# Patient Record
Sex: Female | Born: 1965 | Race: White | Hispanic: No | Smoking: Never smoker
Health system: Southern US, Community
[De-identification: ages and names within clinical notes are randomized; demographics above are authoritative.]

## PROBLEM LIST (undated history)

## (undated) DIAGNOSIS — J45909 Unspecified asthma, uncomplicated: Secondary | ICD-10-CM

## (undated) DIAGNOSIS — G510 Bell's palsy: Secondary | ICD-10-CM

## (undated) DIAGNOSIS — E7800 Pure hypercholesterolemia, unspecified: Secondary | ICD-10-CM

## (undated) DIAGNOSIS — F32A Depression, unspecified: Secondary | ICD-10-CM

## (undated) DIAGNOSIS — E119 Type 2 diabetes mellitus without complications: Secondary | ICD-10-CM

## (undated) DIAGNOSIS — I639 Cerebral infarction, unspecified: Secondary | ICD-10-CM

## (undated) HISTORY — PX: APPENDECTOMY: SHX54

## (undated) HISTORY — PX: OOPHORECTOMY: SHX86

---

## 2006-02-16 ENCOUNTER — Emergency Department (HOSPITAL_COMMUNITY): Admission: EM | Admit: 2006-02-16 | Discharge: 2006-02-17 | Payer: Self-pay | Admitting: Emergency Medicine

## 2017-09-17 ENCOUNTER — Emergency Department (INDEPENDENT_AMBULATORY_CARE_PROVIDER_SITE_OTHER)
Admission: EM | Admit: 2017-09-17 | Discharge: 2017-09-17 | Disposition: A | Discharge status: Home / Self Care | Payer: Managed Care, Other (non HMO) | Source: Home / Self Care | Attending: Family Medicine | Admitting: Family Medicine

## 2017-09-17 ENCOUNTER — Emergency Department (INDEPENDENT_AMBULATORY_CARE_PROVIDER_SITE_OTHER): Payer: Managed Care, Other (non HMO)

## 2017-09-17 ENCOUNTER — Other Ambulatory Visit: Payer: Self-pay

## 2017-09-17 DIAGNOSIS — M778 Other enthesopathies, not elsewhere classified: Secondary | ICD-10-CM

## 2017-09-17 DIAGNOSIS — M779 Enthesopathy, unspecified: Principal | ICD-10-CM

## 2017-09-17 DIAGNOSIS — M25571 Pain in right ankle and joints of right foot: Secondary | ICD-10-CM

## 2017-09-17 DIAGNOSIS — M7750 Other enthesopathy of unspecified foot: Secondary | ICD-10-CM

## 2017-09-17 DIAGNOSIS — M7731 Calcaneal spur, right foot: Secondary | ICD-10-CM

## 2017-09-17 HISTORY — DX: Type 2 diabetes mellitus without complications: E11.9

## 2017-09-17 MED ORDER — HYDROCODONE-ACETAMINOPHEN 5-325 MG PO TABS: 1.0000 | ORAL_TABLET | Freq: Four times a day (QID) | ORAL | 0 refills | Status: DC | PRN

## 2017-09-17 NOTE — Discharge Instructions (Signed)
Apply heating pad 2 or 3 times daily.  Elevate.  Use crutches for 3 to 5 days.  Wear Ace wrap until swelling decreases.   Begin range of motion and stretching exercises.

## 2017-09-17 NOTE — ED Provider Notes (Signed)
[facility]    CSN: [id number] Arrival date & time: 09/17/17  1913     History   Chief Complaint Chief Complaint  Patient presents with  . Ankle Pain    Right  . Foot Pain    HPI Teresa Maxwell is a 51 y.o. female.   Patient reports that she has been having intermittent pain in the dorsum of her right foot for over two weeks.  She fell two weeks ago, but does not recall injuring her right foot although her pain and swelling have gradually increased.  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  She reports that application of ice packs has not been helpful, and she has had poor response to ibuprofen.   The history is provided by the patient.  Foot Pain  This is a recurrent problem. Episode onset: 2+ weeks ago. The problem occurs constantly. The problem has been gradually worsening. The symptoms are aggravated by walking and standing. Nothing relieves the symptoms. Treatments tried: Ibuprofen. The treatment provided mild relief.    Past Medical History:  Diagnosis Date  . Diabetes mellitus without complication (HCC)    type II    There are no active problems to display for this patient.   Past Surgical History:  Procedure Laterality Date  . APPENDECTOMY    . OOPHORECTOMY     right    OB History    No data available       Home Medications    Prior to Admission medications   Medication Sig Start Date End Date Taking? Authorizing Provider  ibuprofen (ADVIL,MOTRIN) 600 MG tablet Take 600 mg by mouth every 6 (six) hours as needed.   Yes [provider]  levonorgestrel (MIRENA) 20 MCG/24HR IUD 1 each by Intrauterine route once.   Yes [provider]  HYDROcodone-acetaminophen (NORCO/VICODIN) 5-325 MG tablet Take 1 tablet by mouth every 6 (six) hours as needed for moderate pain. 09/17/17   Lattie Haw, MD    Family History Family History    Problem Relation Age of Onset  . Heart failure Mother   . Cancer Father     Social History Social History   Tobacco Use  . Smoking status: Never Smoker  . Smokeless tobacco: Never Used  Substance Use Topics  . Alcohol use: No    Frequency: Never  . Drug use: No     Allergies   Other   Review of Systems Review of Systems  All other systems reviewed and are negative.    Physical Exam Triage Vital Signs ED Triage Vitals  Enc Vitals Group     BP 09/17/17 1937 137/82     Pulse Rate 09/17/17 1937 85     Resp --      Temp 09/17/17 1937 98.5 F (36.9 C)     Temp Source 09/17/17 1937 Oral     SpO2 09/17/17 1937 97 %     Weight 09/17/17 1938 198 lb (89.8 kg)     Height 09/17/17 1938 5\' 8"  (1.727 m)     Head Circumference --      Peak Flow --      Pain Score 09/17/17 1938 4     Pain Loc --      Pain Edu? --      Excl. in GC? --    No data found.  Updated Vital Signs BP 137/82 (BP Location:  Right Arm)   Pulse 85   Temp 98.5 F (36.9 C) (Oral)   Ht 5\' 8"  (1.727 m)   Wt 198 lb (89.8 kg)   SpO2 97%   BMI 30.11 kg/m   Visual Acuity Right Eye Distance:   Left Eye Distance:   Bilateral Distance:    Right Eye Near:   Left Eye Near:    Bilateral Near:     Physical Exam  Constitutional: She appears well-developed and well-nourished. No distress.  HENT:  Head: Atraumatic.  Eyes: Pupils are equal, round, and reactive to light.  Cardiovascular: Normal rate.  Pulmonary/Chest: Effort normal.  Musculoskeletal:       Right foot: There is tenderness and swelling. There is normal range of motion, no bony tenderness, normal capillary refill, no crepitus, no deformity and no laceration.       Feet:  Right foot is mildly swollen dorsally without erythema.  There is distinct tenderness to palpation dorsally over the extensor tendons, and pain is recreated by palpating there during resisted dorsiflexion. Right foot has tenderness to palpation over posterior tibial  tendon extending into arch.  Pain elicited by resisted plantar flexion and resisted inversion of the ankle while palpating over the posterior tibial tendon.  Neurological: She is alert.  Skin: Skin is warm and dry. No rash noted.  Nursing note and vitals reviewed.    UC Treatments / Results  Labs (all labs ordered are listed, but only abnormal results are displayed) Labs Reviewed - No data to display  EKG  EKG Interpretation None       Radiology Dg Ankle Complete Right  Result Date: 09/17/2017 CLINICAL DATA:  Fall 2 weeks ago. Right ankle pain and swelling. Initial encounter. EXAM: RIGHT ANKLE - COMPLETE 3+ VIEW COMPARISON:  None. FINDINGS: Diffuse soft tissue swelling is noted. A well corticated ossific density is seen along the inferior margin of the lateral malleolus, consistent with an old avulsion injury. No acute fracture identified. No evidence of dislocation. No other significant bone abnormality identified. IMPRESSION: Diffuse soft tissue swelling. No evidence of acute fracture or dislocation. Electronically Signed   By: Myles RosenthalJohn  Stahl M.D.   On: 09/17/2017 20:05   Dg Foot Complete Right  Result Date: 09/17/2017 CLINICAL DATA:  Right foot pain after remote fall 2 weeks ago. EXAM: RIGHT FOOT COMPLETE - 3+ VIEW COMPARISON:  None. FINDINGS: There is soft tissue swelling about the malleoli and midfoot. On the PA view of the foot, there are 2 ossific densities adjacent to the posteromedial aspect of the navicular. These range in size from 3 to 4 mm. These may simply reflect accessory ossicles. If the patient is point tender over the medial midfoot, these ossific densities could potentially represent small avulsions. A donor site however is not clearly identified and findings are more likely to represent accessory ossicles. Joint spaces are maintained. There is a small plantar calcaneal enthesophyte. IMPRESSION: 1. Two small ossific densities ranging in size from 3-4 mm are noted adjacent  to the navicular bone. Findings likely represent small accessory ossicles however the possibility of small avulsions is not excluded though believed less likely given lack of a donor site. 2. Generalized soft tissue swelling about the malleoli and midfoot. 3. Small plantar calcaneal enthesophyte. Electronically Signed   By: Tollie Ethavid  Kwon M.D.   On: 09/17/2017 20:07    Procedures Procedures (including critical care time)  Medications Ordered in UC Medications - No data to display   Initial Impression / Assessment and Plan /  UC Course  I have reviewed the triage vital signs and the nursing notes.  Pertinent labs & imaging results that were available during my care of the patient were reviewed by me and considered in my medical decision making (see chart for details).    Suspect posterior tibial tendonitis as well. Ace wrap applied.  Dispensed crutches. She is reluctant to try prednisone because of her type 2 diabetes. Rx for Lortab at bedtime. Controlled Substance Prescriptions I have consulted the [place] Controlled Substances Registry for this patient, and feel the risk/benefit ratio today is favorable for proceeding with this prescription for a controlled substance.  Apply heating pad 2 or 3 times daily.  Elevate.  Use crutches for 3 to 5 days.  Wear Ace wrap until swelling decreases.   Begin calf stretching exercises. Arrange follow-up visit with Sports Medicine.    Final Clinical Impressions(s) / UC Diagnoses   Final diagnoses:  Extensor tendonitis of foot    ED Discharge Orders        Ordered    HYDROcodone-acetaminophen (NORCO/VICODIN) 5-325 MG tablet  Every 6 hours PRN     09/17/17 2033           Lattie HawBeese, Stephen A, MD 09/19/17 1245

## 2017-09-17 NOTE — ED Triage Notes (Signed)
Pt stated she has had pain in the top of her foot for a couple of weeks.  Last night worked a 12 hour shift, and swelling and pain increased.  Today the pain has been much worse.  Swelling from just above her ankle through the foot.

## 2017-09-24 ENCOUNTER — Ambulatory Visit: Payer: Managed Care, Other (non HMO) | Admitting: Sports Medicine

## 2018-03-06 HISTORY — PX: BREAST BIOPSY: SHX20

## 2018-03-09 ENCOUNTER — Other Ambulatory Visit: Payer: Self-pay | Admitting: Obstetrics and Gynecology

## 2018-03-09 DIAGNOSIS — R928 Other abnormal and inconclusive findings on diagnostic imaging of breast: Secondary | ICD-10-CM

## 2018-03-15 ENCOUNTER — Ambulatory Visit
Admission: RE | Admit: 2018-03-15 | Discharge: 2018-03-15 | Disposition: A | Discharge status: Home / Self Care | Payer: Managed Care, Other (non HMO) | Source: Ambulatory Visit | Attending: Obstetrics and Gynecology | Admitting: Obstetrics and Gynecology

## 2018-03-15 ENCOUNTER — Other Ambulatory Visit: Payer: Self-pay | Admitting: Obstetrics and Gynecology

## 2018-03-15 DIAGNOSIS — R928 Other abnormal and inconclusive findings on diagnostic imaging of breast: Secondary | ICD-10-CM

## 2018-03-19 ENCOUNTER — Ambulatory Visit
Admission: RE | Admit: 2018-03-19 | Discharge: 2018-03-19 | Disposition: A | Discharge status: Home / Self Care | Payer: Managed Care, Other (non HMO) | Source: Ambulatory Visit | Attending: Obstetrics and Gynecology | Admitting: Obstetrics and Gynecology

## 2018-03-19 ENCOUNTER — Other Ambulatory Visit: Payer: Self-pay | Admitting: Obstetrics and Gynecology

## 2018-03-19 DIAGNOSIS — R928 Other abnormal and inconclusive findings on diagnostic imaging of breast: Secondary | ICD-10-CM

## 2018-07-27 ENCOUNTER — Other Ambulatory Visit (HOSPITAL_COMMUNITY): Payer: Self-pay | Admitting: Internal Medicine

## 2018-07-27 DIAGNOSIS — R1011 Right upper quadrant pain: Secondary | ICD-10-CM

## 2018-07-28 ENCOUNTER — Ambulatory Visit (HOSPITAL_COMMUNITY)
Admission: RE | Admit: 2018-07-28 | Discharge: 2018-07-28 | Disposition: A | Discharge status: Home / Self Care | Payer: Managed Care, Other (non HMO) | Source: Ambulatory Visit | Attending: Internal Medicine | Admitting: Internal Medicine

## 2018-07-28 DIAGNOSIS — R1011 Right upper quadrant pain: Secondary | ICD-10-CM | POA: Diagnosis present

## 2018-07-28 DIAGNOSIS — R932 Abnormal findings on diagnostic imaging of liver and biliary tract: Secondary | ICD-10-CM | POA: Insufficient documentation

## 2018-08-26 ENCOUNTER — Other Ambulatory Visit (HOSPITAL_COMMUNITY): Payer: Self-pay | Admitting: Internal Medicine

## 2018-08-26 DIAGNOSIS — R1011 Right upper quadrant pain: Secondary | ICD-10-CM

## 2018-09-14 ENCOUNTER — Ambulatory Visit (HOSPITAL_COMMUNITY)
Admission: RE | Admit: 2018-09-14 | Discharge: 2018-09-14 | Disposition: A | Discharge status: Home / Self Care | Payer: Managed Care, Other (non HMO) | Source: Ambulatory Visit | Attending: Internal Medicine | Admitting: Internal Medicine

## 2018-09-14 DIAGNOSIS — R1011 Right upper quadrant pain: Secondary | ICD-10-CM | POA: Diagnosis present

## 2018-09-14 MED ORDER — TECHNETIUM TC 99M MEBROFENIN IV KIT
5.0000 | PACK | Freq: Once | INTRAVENOUS | Status: AC | PRN
  Administered 2018-09-14: 5 via INTRAVENOUS

## 2018-11-03 DIAGNOSIS — E119 Type 2 diabetes mellitus without complications: Secondary | ICD-10-CM | POA: Diagnosis not present

## 2018-11-15 DIAGNOSIS — R1032 Left lower quadrant pain: Secondary | ICD-10-CM | POA: Diagnosis not present

## 2018-11-15 DIAGNOSIS — Z6829 Body mass index (BMI) 29.0-29.9, adult: Secondary | ICD-10-CM | POA: Diagnosis not present

## 2018-11-15 DIAGNOSIS — R1012 Left upper quadrant pain: Secondary | ICD-10-CM | POA: Diagnosis not present

## 2018-11-24 DIAGNOSIS — R194 Change in bowel habit: Secondary | ICD-10-CM | POA: Diagnosis not present

## 2018-11-24 DIAGNOSIS — R109 Unspecified abdominal pain: Secondary | ICD-10-CM | POA: Diagnosis not present

## 2018-11-24 DIAGNOSIS — K5900 Constipation, unspecified: Secondary | ICD-10-CM | POA: Diagnosis not present

## 2018-11-26 ENCOUNTER — Other Ambulatory Visit: Payer: Self-pay | Admitting: Gastroenterology

## 2018-11-26 DIAGNOSIS — R109 Unspecified abdominal pain: Secondary | ICD-10-CM

## 2018-12-03 ENCOUNTER — Ambulatory Visit
Admission: RE | Admit: 2018-12-03 | Discharge: 2018-12-03 | Disposition: A | Discharge status: Home / Self Care | Payer: BLUE CROSS/BLUE SHIELD | Source: Ambulatory Visit | Attending: Gastroenterology | Admitting: Gastroenterology

## 2018-12-03 DIAGNOSIS — D259 Leiomyoma of uterus, unspecified: Secondary | ICD-10-CM | POA: Diagnosis not present

## 2018-12-03 DIAGNOSIS — R109 Unspecified abdominal pain: Secondary | ICD-10-CM

## 2018-12-03 MED ORDER — IOPAMIDOL (ISOVUE-300) INJECTION 61%
100.0000 mL | Freq: Once | INTRAVENOUS | Status: AC | PRN
  Administered 2018-12-03: 100 mL via INTRAVENOUS

## 2019-03-28 DIAGNOSIS — E7849 Other hyperlipidemia: Secondary | ICD-10-CM | POA: Diagnosis not present

## 2019-03-28 DIAGNOSIS — E119 Type 2 diabetes mellitus without complications: Secondary | ICD-10-CM | POA: Diagnosis not present

## 2019-03-28 DIAGNOSIS — Z0000 Encounter for general adult medical examination without abnormal findings: Secondary | ICD-10-CM | POA: Diagnosis not present

## 2019-04-04 DIAGNOSIS — Z0000 Encounter for general adult medical examination without abnormal findings: Secondary | ICD-10-CM | POA: Diagnosis not present

## 2019-04-04 DIAGNOSIS — E785 Hyperlipidemia, unspecified: Secondary | ICD-10-CM | POA: Diagnosis not present

## 2019-04-04 DIAGNOSIS — Z1331 Encounter for screening for depression: Secondary | ICD-10-CM | POA: Diagnosis not present

## 2019-04-04 DIAGNOSIS — J45909 Unspecified asthma, uncomplicated: Secondary | ICD-10-CM | POA: Diagnosis not present

## 2019-04-04 DIAGNOSIS — H8110 Benign paroxysmal vertigo, unspecified ear: Secondary | ICD-10-CM | POA: Diagnosis not present

## 2019-04-04 DIAGNOSIS — E119 Type 2 diabetes mellitus without complications: Secondary | ICD-10-CM | POA: Diagnosis not present

## 2019-08-25 DIAGNOSIS — Z20818 Contact with and (suspected) exposure to other bacterial communicable diseases: Secondary | ICD-10-CM | POA: Diagnosis not present

## 2019-08-25 DIAGNOSIS — B349 Viral infection, unspecified: Secondary | ICD-10-CM | POA: Diagnosis not present

## 2019-08-25 DIAGNOSIS — R0600 Dyspnea, unspecified: Secondary | ICD-10-CM | POA: Diagnosis not present

## 2019-08-25 DIAGNOSIS — R05 Cough: Secondary | ICD-10-CM | POA: Diagnosis not present

## 2019-10-16 DIAGNOSIS — Z23 Encounter for immunization: Secondary | ICD-10-CM | POA: Diagnosis not present

## 2020-07-30 IMAGING — NM NM HEPATO W/GB/PHARM/[PERSON_NAME]
3 series · 13 of 13 positions shown · non-contrast
Comparison: None.

CLINICAL DATA: Abdominal pain and nausea

EXAM:
NUCLEAR MEDICINE HEPATOBILIARY IMAGING WITH GALLBLADDER EF
VIEWS:
Anterior, right lateral right upper quadrant
RADIOPHARMACEUTICALS:  5.15 mCi 1c-UUm  Choletec IV

[he hepatobiliary · 2.26mm/px · 1 of 1 slices shown (1 of 3)]
[im 1/1]
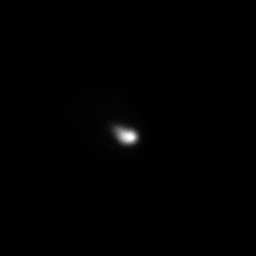

[he hepatobiliary · 4.52mm/px · 6 of 60 frames shown (2 of 3)]
[frame 6/60]
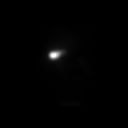
[frame 16/60]
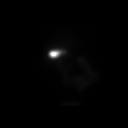
[frame 26/60]
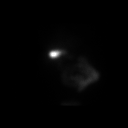
[frame 36/60]
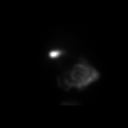
[frame 46/60]
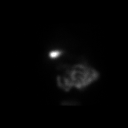
[frame 56/60]
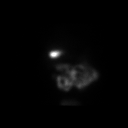

[he hepatobiliary · 4.52mm/px · 6 of 60 frames shown (3 of 3)]
[frame 6/60]
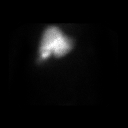
[frame 16/60]
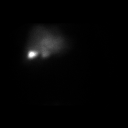
[frame 26/60]
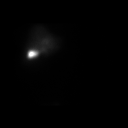
[frame 36/60]
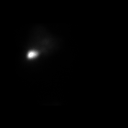
[frame 46/60]
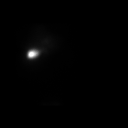
[frame 56/60]
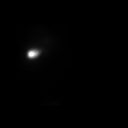

[13 of 13 positions shown; findings below may reference images not displayed]

FINDINGS: Liver uptake of radiotracer is unremarkable. There is prompt
visualization of gallbladder and small bowel, indicating patency of
the cystic and common bile ducts. The patient consumed 8 ounces of
Ensure orally with calculation of the computer generated ejection
fraction of radiotracer from the gallbladder. The patient
experienced nausea with the oral Ensure consumption. The computer
generated ejection fraction of radiotracer from the gallbladder is
normal at 58%, normal greater than 33% using the oral agent.
IMPRESSION: Normal ejection fraction of radiotracer from the gallbladder. The
patient did experience nausea with the oral Ensure consumption.
Cystic and common bile ducts are patent as is evidenced by
visualization of gallbladder and small bowel.

## 2020-10-15 DIAGNOSIS — Z1152 Encounter for screening for COVID-19: Secondary | ICD-10-CM | POA: Diagnosis not present

## 2020-10-18 IMAGING — CT CT ABD-PELV W/ CM
1 of 3 series · 13 of 32 positions shown, 19 images · IV contrast (iopamidol)
Comparison: None.

CLINICAL DATA: Mid and lower abdominal pain for 3 weeks.
Diverticulitis. Endometriosis. Previous appendectomy and right
oophorectomy.

EXAM:
CT ABDOMEN AND PELVIS WITH CONTRAST
TECHNIQUE: Multidetector CT imaging of the abdomen and pelvis was performed
using the standard protocol following bolus administration of
intravenous contrast.
CONTRAST:  100mL 8PV4GS-BEE IOPAMIDOL (8PV4GS-BEE) INJECTION 61%

[Series 2: abd/pelvis w/cm · axial · 0.88mm/px · z∈[-487,-67]mm · 13 of 99 slices shown, 19 images]
[im 8/99  soft-tissue]
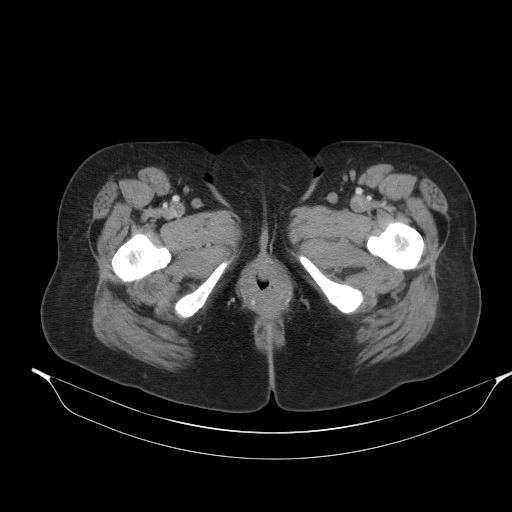
[im 8/99  bone]
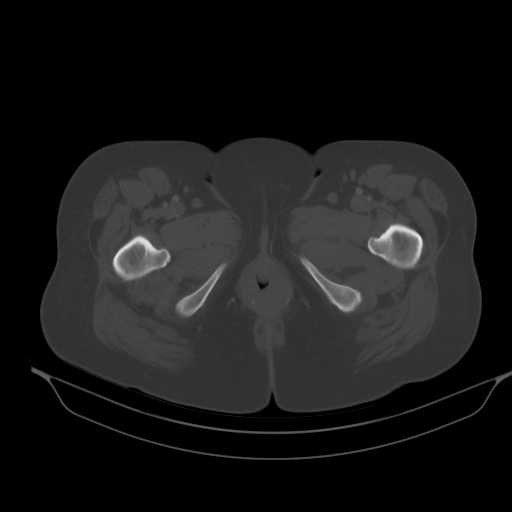
[im 15/99  soft-tissue]
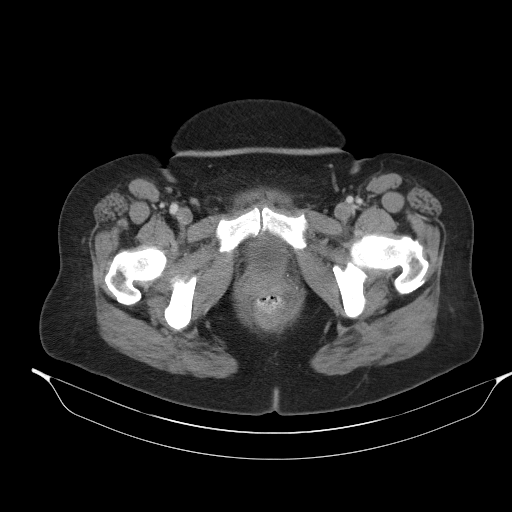
[im 22/99  soft-tissue]
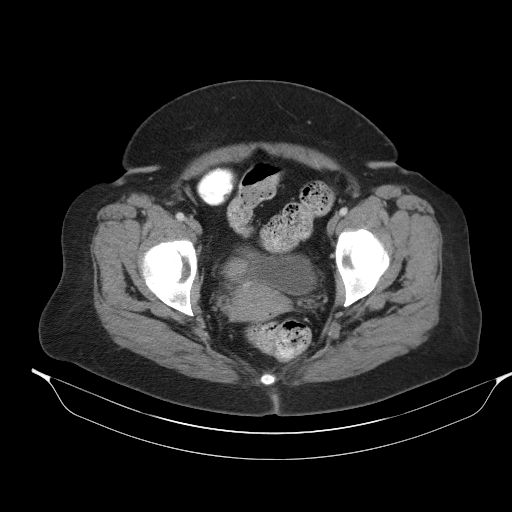
[im 29/99  soft-tissue]
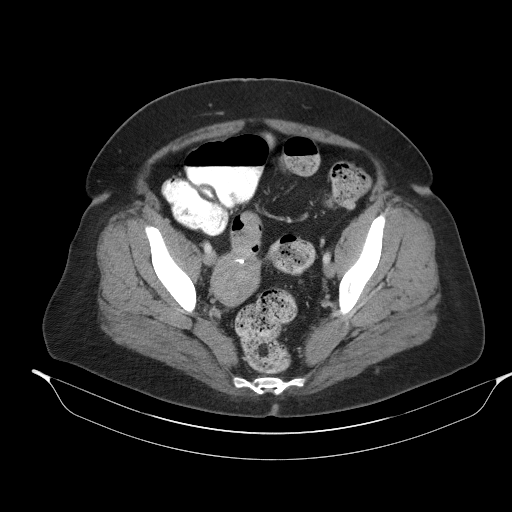
[im 36/99  soft-tissue]
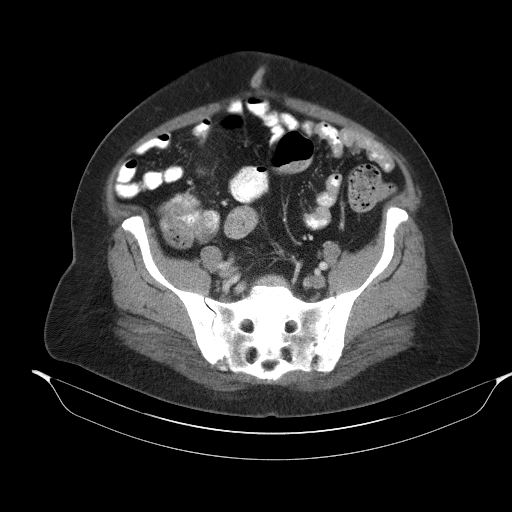
[im 43/99  soft-tissue]
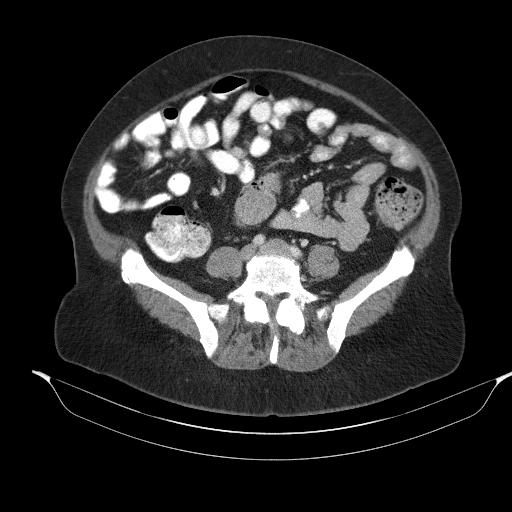
[im 50/99  soft-tissue]
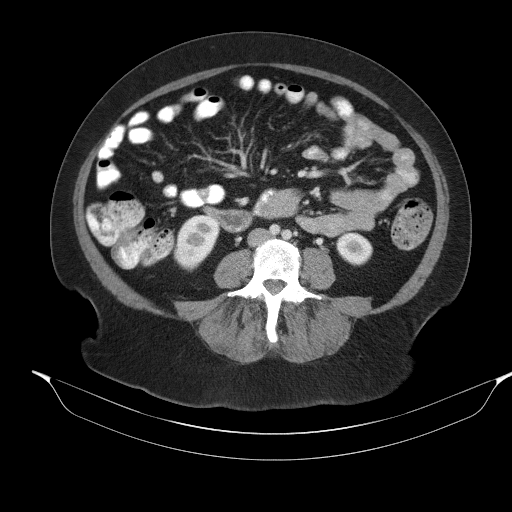
[im 57/99  soft-tissue]
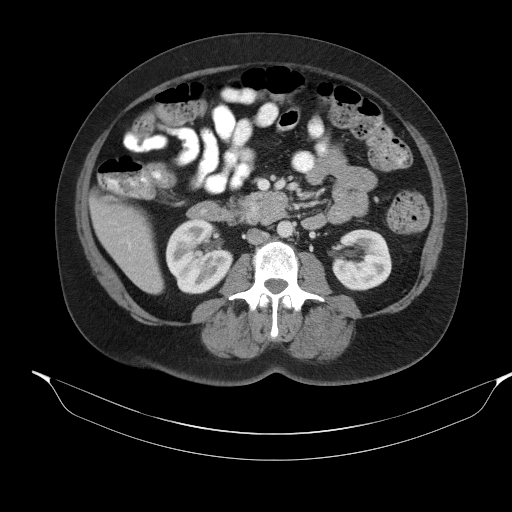
[im 64/99  soft-tissue]
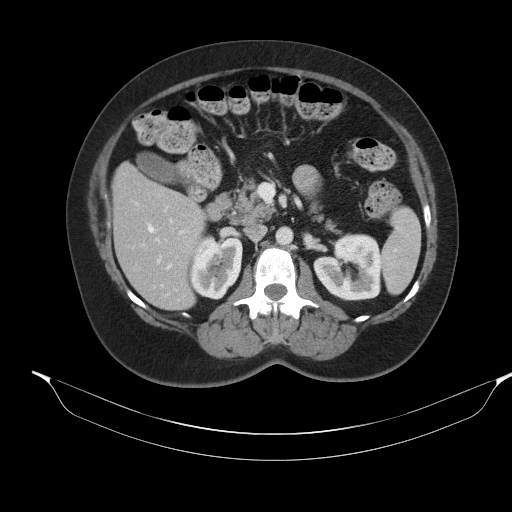
[im 64/99  bone]
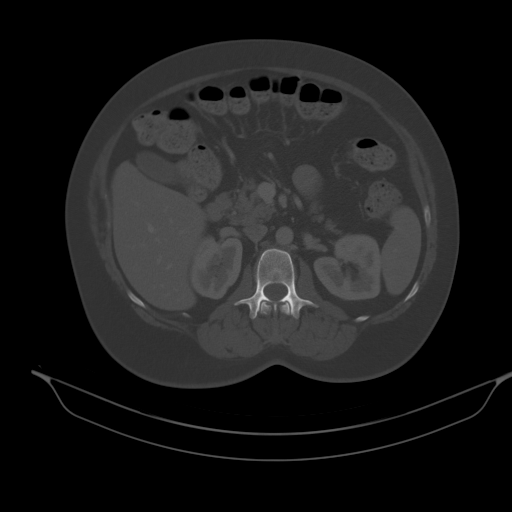
[im 71/99  soft-tissue]
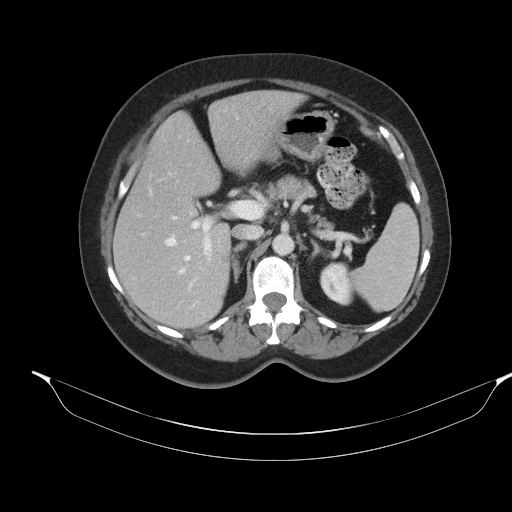
[im 71/99  lung]
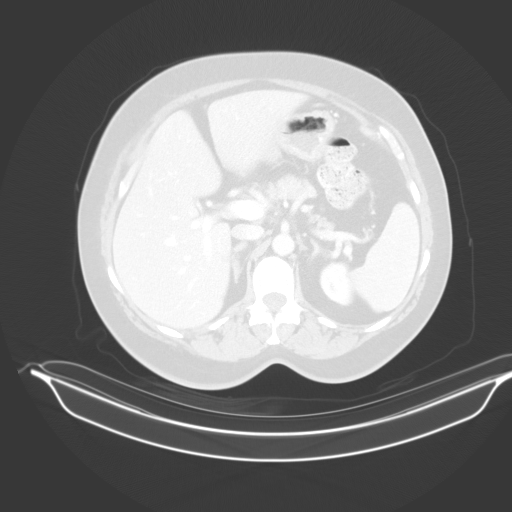
[im 78/99  soft-tissue]
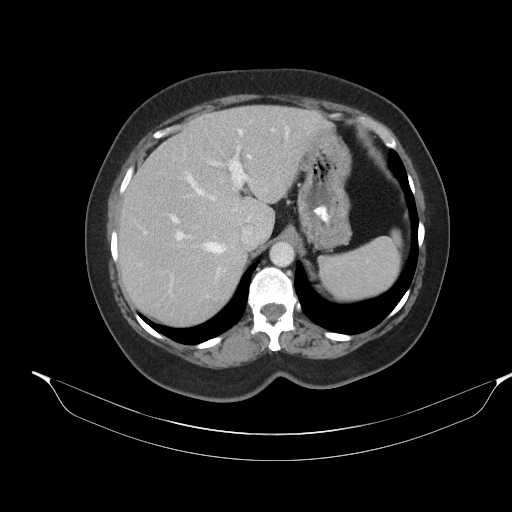
[im 78/99  lung]
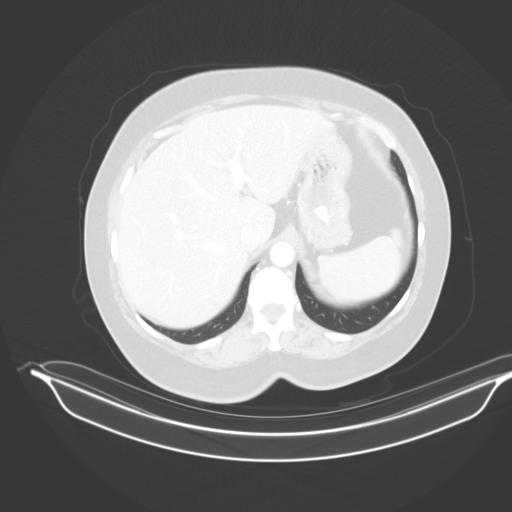
[im 85/99  soft-tissue]
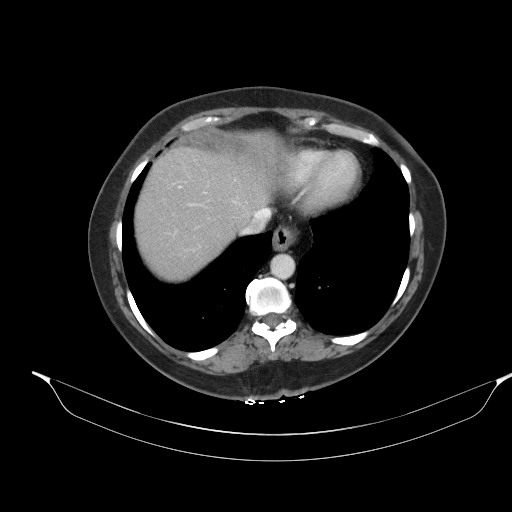
[im 85/99  lung]
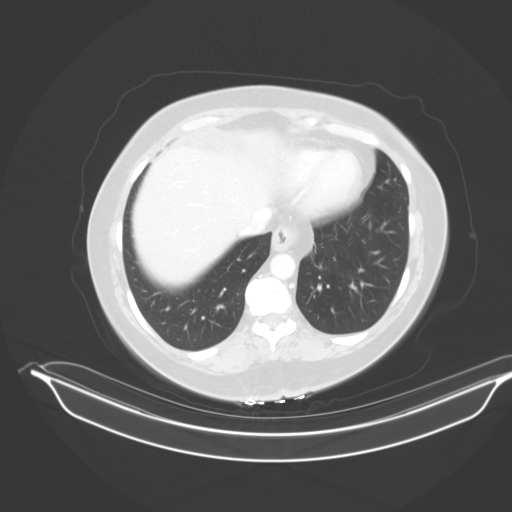
[im 92/99  soft-tissue]
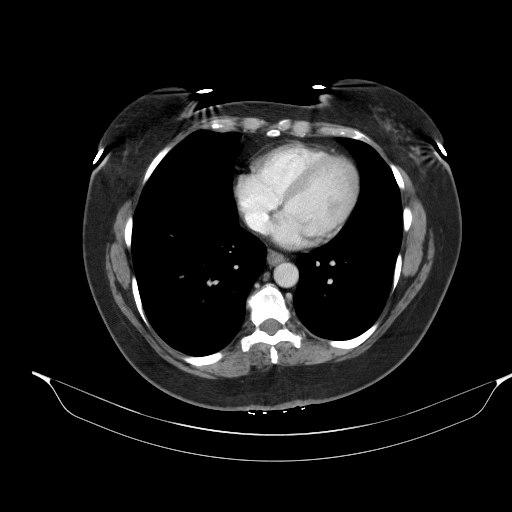
[im 92/99  lung]
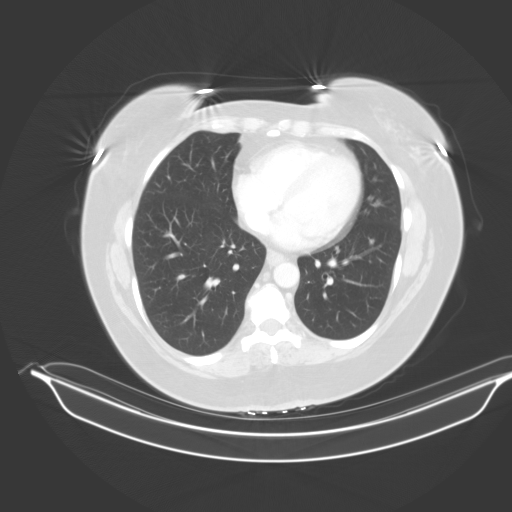

[13 of 32 positions shown; findings below may reference images not displayed]

FINDINGS: Lower Chest: No acute findings.

Hepatobiliary: No hepatic masses identified. Gallbladder is
unremarkable.

Pancreas:  No mass or inflammatory changes.

Spleen: Within normal limits in size and appearance.

Adrenals/Urinary Tract: No masses identified. Probable tiny sub-cm
right renal cyst noted. No evidence of ureteral calculi or
hydronephrosis.

Stomach/Bowel: Small hiatal hernia noted. No evidence of
obstruction, inflammatory process or abnormal fluid collections.

Vascular/Lymphatic: No pathologically enlarged lymph nodes. No
abdominal aortic aneurysm.

Reproductive: IUD is seen in expected position in the uterus. A few
small posterior uterine fibroids are seen, largest measuring
approximately 2.5 cm. Adnexal regions are unremarkable. No evidence
of ascites or peritoneal thickening.

Other:  None.

Musculoskeletal:  No suspicious bone lesions identified.
IMPRESSION: 1. No acute findings within the abdomen or pelvis.
2. Small hiatal hernia.
3. Several small uterine fibroids.  IUD in expected position.

## 2022-02-08 ENCOUNTER — Encounter (HOSPITAL_COMMUNITY): Payer: Self-pay | Admitting: Emergency Medicine

## 2022-02-08 ENCOUNTER — Other Ambulatory Visit: Payer: Self-pay

## 2022-02-08 ENCOUNTER — Emergency Department (HOSPITAL_COMMUNITY)
Admission: EM | Admit: 2022-02-08 | Discharge: 2022-02-08 | Disposition: A | Discharge status: Home / Self Care | Payer: BC Managed Care – PPO | Attending: Emergency Medicine | Admitting: Emergency Medicine

## 2022-02-08 DIAGNOSIS — R739 Hyperglycemia, unspecified: Secondary | ICD-10-CM | POA: Diagnosis present

## 2022-02-08 DIAGNOSIS — E1165 Type 2 diabetes mellitus with hyperglycemia: Secondary | ICD-10-CM | POA: Insufficient documentation

## 2022-02-08 HISTORY — DX: Pure hypercholesterolemia, unspecified: E78.00

## 2022-02-08 HISTORY — DX: Depression, unspecified: F32.A

## 2022-02-08 LAB — URINALYSIS, ROUTINE W REFLEX MICROSCOPIC
Bilirubin Urine: NEGATIVE
Glucose, UA: NEGATIVE mg/dL
Hgb urine dipstick: NEGATIVE
Ketones, ur: NEGATIVE mg/dL
Nitrite: NEGATIVE
Protein, ur: NEGATIVE mg/dL
Specific Gravity, Urine: 1.019 (ref 1.005–1.030)
pH: 5 (ref 5.0–8.0)

## 2022-02-08 LAB — BASIC METABOLIC PANEL
Anion gap: 12 (ref 5–15)
BUN: 18 mg/dL (ref 6–20)
CO2: 20 mmol/L — ABNORMAL LOW (ref 22–32)
Calcium: 9.7 mg/dL (ref 8.9–10.3)
Chloride: 102 mmol/L (ref 98–111)
Creatinine, Ser: 0.79 mg/dL (ref 0.44–1.00)
GFR, Estimated: >60 mL/min (ref >60)
Glucose, Bld: 225 mg/dL — ABNORMAL HIGH (ref 70–99)
Potassium: 3.8 mmol/L (ref 3.5–5.1)
Sodium: 134 mmol/L — ABNORMAL LOW (ref 135–145)

## 2022-02-08 LAB — CBC
HCT: 40.7 % (ref 36.0–46.0)
Hemoglobin: 13.2 g/dL (ref 12.0–15.0)
MCH: 26.7 pg (ref 26.0–34.0)
MCHC: 32.4 g/dL (ref 30.0–36.0)
MCV: 82.2 fL (ref 80.0–100.0)
Platelets: 361 10*3/uL (ref 150–400)
RBC: 4.95 MIL/uL (ref 3.87–5.11)
RDW: 13.2 % (ref 11.5–15.5)
WBC: 9.2 10*3/uL (ref 4.0–10.5)
nRBC: 0 % (ref 0.0–0.2)

## 2022-02-08 LAB — CBG MONITORING, ED
Glucose-Capillary: 208 mg/dL — ABNORMAL HIGH (ref 70–99)
Glucose-Capillary: 140 mg/dL — ABNORMAL HIGH (ref 70–99)

## 2022-02-08 LAB — I-STAT BETA HCG BLOOD, ED (MC, WL, AP ONLY): I-stat hCG, quantitative: <5 m[IU]/mL (ref <5)

## 2022-02-08 MED ORDER — ONDANSETRON HCL 4 MG/2ML IJ SOLN: 4.0000 mg | Freq: Once | INTRAMUSCULAR | Status: DC

## 2022-02-08 MED ORDER — SODIUM CHLORIDE 0.9 % IV BOLUS
1000.0000 mL | Freq: Once | INTRAVENOUS | Status: AC
  Administered 2022-02-08: 1000 mL via INTRAVENOUS

## 2022-02-08 NOTE — ED Triage Notes (Signed)
[place] didn't check blood sugars.  States she had generalized abd pain, nausea, and fatigue.  Abd pain and nausea has resolved.  Fasting blood sugar 280 yesterday and 318 today.  Taking Metformin and Jardiance as prescribed.  Highest blood sugar today 457.  C/o fatigue. ?

## 2022-02-08 NOTE — ED Provider Notes (Signed)
?[facility] ?Provider Note ? ? ?CSN: [id number] ?Arrival date & time: 02/08/22  1536 ? ?  ? ?History ? ?Chief Complaint  ?Patient presents with  ? Hyperglycemia  ? ? ?Teresa Maxwell is a 56 y.o. female.  She has a history of diabetes.  Few days ago she had a little bit of abdominal pain and nausea.  Since then she is just felt fatigued and has had elevated blood sugars.  Abdominal pain has been resolved.  No fevers or chills.  No chest pain shortness of breath cough.  No urinary symptoms.  She has noted some blurry vision.  Baseline blood sugars usually are between 120 and 180 and she was getting numbers in the 300s and 400s.  No change in her regiment. ? ?The history is provided by the patient.  ?Hyperglycemia ?Blood sugar level PTA:  400 ?Onset quality:  Gradual ?Duration:  2 days ?Progression:  Unchanged ?Chronicity:  New ?Context: not noncompliance   ?Relieved by:  Nothing ?Associated symptoms: abdominal pain, blurred vision and fatigue   ?Associated symptoms: no chest pain, no dysuria, no fever, no shortness of breath and no vomiting   ? ?  ? ?Home Medications ?Prior to Admission medications   ?Medication Sig Start Date End Date Taking? Authorizing Provider  ?HYDROcodone-acetaminophen (NORCO/VICODIN) 5-325 MG tablet Take 1 tablet by mouth every 6 (six) hours as needed for moderate pain. 09/17/17   Kandra Nicolas, MD  ?ibuprofen (ADVIL,MOTRIN) 600 MG tablet Take 600 mg by mouth every 6 (six) hours as needed.    [provider]  ?levonorgestrel (MIRENA) 20 MCG/24HR IUD 1 each by Intrauterine route once.    [provider]  ?   ? ?Allergies    ?Other   ? ?Review of Systems   ?Review of Systems  ?Constitutional:  Positive for fatigue. Negative for fever.  ?Eyes:  Positive for blurred vision and visual disturbance.  ?Respiratory:  Negative for shortness of breath.   ?Cardiovascular:  Negative for chest pain.  ?Gastrointestinal:  Positive for abdominal pain.  Negative for vomiting.  ?Genitourinary:  Negative for dysuria.  ? ?Physical Exam ?Updated Vital Signs ?BP 132/78   Pulse (!) 102   Temp 98.3 ?F (36.8 ?C) (Oral)   Resp 14   SpO2 96%  ?Physical Exam ?Vitals and nursing note reviewed.  ?Constitutional:   ?   General: She is not in acute distress. ?   Appearance: Normal appearance. She is well-developed.  ?HENT:  ?   Head: Normocephalic and atraumatic.  ?Eyes:  ?   Conjunctiva/sclera: Conjunctivae normal.  ?Cardiovascular:  ?   Rate and Rhythm: Normal rate and regular rhythm.  ?   Heart sounds: No murmur heard. ?Pulmonary:  ?   Effort: Pulmonary effort is normal. No respiratory distress.  ?   Breath sounds: Normal breath sounds.  ?Abdominal:  ?   Palpations: Abdomen is soft.  ?   Tenderness: There is no abdominal tenderness. There is no guarding or rebound.  ?Musculoskeletal:     ?   General: No swelling. Normal range of motion.  ?   Cervical back: Neck supple.  ?Skin: ?   General: Skin is warm and dry.  ?   Capillary Refill: Capillary refill takes less than 2 seconds.  ?Neurological:  ?   General: No focal deficit present.  ?   Mental Status: She is alert.  ?   Cranial Nerves: No cranial nerve deficit.  ?   Sensory: No sensory  deficit.  ?   Motor: No weakness.  ? ? ?ED Results / Procedures / Treatments   ?Labs ?(all labs ordered are listed, but only abnormal results are displayed) ?Labs Reviewed  ?BASIC METABOLIC PANEL - Abnormal; Notable for the following components:  ?    Result Value  ? Sodium 134 (*)   ? CO2 20 (*)   ? Glucose, Bld 225 (*)   ? All other components within normal limits  ?URINALYSIS, ROUTINE W REFLEX MICROSCOPIC - Abnormal; Notable for the following components:  ? APPearance HAZY (*)   ? Leukocytes,Ua MODERATE (*)   ? Bacteria, UA RARE (*)   ? All other components within normal limits  ?CBG MONITORING, ED - Abnormal; Notable for the following components:  ? Glucose-Capillary 208 (*)   ? All other components within normal limits  ?CBG MONITORING,  ED - Abnormal; Notable for the following components:  ? Glucose-Capillary 140 (*)   ? All other components within normal limits  ?CBC  ?I-STAT BETA HCG BLOOD, ED ([facility], [facility], [facility] ONLY)  ?CBG MONITORING, ED  ?CBG MONITORING, ED  ? ? ?EKG ?None ? ?Radiology ?No results found. ? ?Procedures ?Procedures  ? ? ?Medications Ordered in ED ?Medications  ?sodium chloride 0.9 % bolus 1,000 mL (has no administration in time range)  ?ondansetron (ZOFRAN) injection 4 mg (has no administration in time range)  ? ? ?ED Course/ Medical Decision Making/ A&P ?Clinical Course as of 02/09/22 0922  ?Sat Feb 08, 2022  ?1908 Patient states she is feeling better after some fluids.  We will recheck blood sugar and see where she is at. [MB]  ?  ?Clinical Course User Index ?[MB] Hayden Rasmussen, MD  ? ?                        ?Medical Decision Making ?Amount and/or Complexity of Data Reviewed ?Labs: ordered. ? ?Risk ?Prescription drug management. ? ?This patient complains of elevated blood sugar fatigue blurry vision; this involves an extensive number of treatment ?Options and is a complaint that carries with it a high risk of complications and ?morbidity. The differential includes hyperglycemia, infection, metabolic derangement ? ?I ordered, reviewed and interpreted labs, which included CBC with normal white count normal hemoglobin, chemistries normal other than mildly low bicarb normal gap, chemistries without clear signs of infection ?I ordered medication IV fluids and reviewed PMP when indicated. ? ?Previous records obtained and reviewed in epic no recent admissions ?Social determinants considered, no significant barriers ?Critical Interventions: None ? ?After the interventions stated above, I reevaluated the patient and found patient to be symptomatically improved and blood sugar improved ?Admission and further testing considered, no indications for admission at this time.  Patient comfortable plan for outpatient follow-up with her treatment  providers.  Return instructions discussed ? ? ? ? ? ? ? ? ? ?Final Clinical Impression(s) / ED Diagnoses ?Final diagnoses:  ?Hyperglycemia  ? ? ?Rx / DC Orders ?ED Discharge Orders   ? ? None  ? ?  ? ? ?  ?Hayden Rasmussen, MD ?02/09/22 [phone] ? ?

## 2022-02-08 NOTE — ED Notes (Signed)
E-signature pad unavailable at time of pt discharge. This RN discussed discharge materials with pt and answered all pt questions. Pt stated understanding of discharge material. ? ?

## 2022-02-08 NOTE — Discharge Instructions (Signed)
You are seen in the emergency department for elevated blood sugar blurry vision and general malaise.  Your blood sugar was elevated here and you did look dehydrated by your lab work.  Your symptoms improved with some IV fluids.  Please continue your regular medications and follow-up with your primary care doctor.  Return to the emergency department if any worsening or concerning symptoms. ?

## 2022-04-03 ENCOUNTER — Ambulatory Visit (INDEPENDENT_AMBULATORY_CARE_PROVIDER_SITE_OTHER): Payer: No Typology Code available for payment source | Admitting: Orthopaedic Surgery

## 2022-04-03 ENCOUNTER — Ambulatory Visit (INDEPENDENT_AMBULATORY_CARE_PROVIDER_SITE_OTHER): Payer: No Typology Code available for payment source

## 2022-04-03 DIAGNOSIS — M25561 Pain in right knee: Secondary | ICD-10-CM | POA: Diagnosis not present

## 2022-04-03 MED ORDER — BUPIVACAINE HCL 0.5 % IJ SOLN
2.0000 mL | INTRAMUSCULAR | Status: AC | PRN
  Administered 2022-04-03: 2 mL via INTRA_ARTICULAR

## 2022-04-03 MED ORDER — METHYLPREDNISOLONE ACETATE 40 MG/ML IJ SUSP
40.0000 mg | INTRAMUSCULAR | Status: AC | PRN
  Administered 2022-04-03: 40 mg via INTRA_ARTICULAR

## 2022-04-03 MED ORDER — LIDOCAINE HCL 1 % IJ SOLN
2.0000 mL | INTRAMUSCULAR | Status: AC | PRN
  Administered 2022-04-03: 2 mL

## 2022-04-03 NOTE — Progress Notes (Signed)
Office Visit Note   Patient: Teresa Maxwell           Date of Birth: 14-Oct-1965           MRN: [id number] Visit Date: 04/03/2022              Requested by: Cleatis Polka., MD [address] [place],  [place] [address] PCP: Cleatis Polka., MD   Assessment & Plan: Visit Diagnoses:  1. Acute pain of right knee     Plan: Impression is acute right medial meniscal tear.  Currently symptoms are only pain without mechanical symptoms.  Treatment options were reviewed and we will do a cortisone injection followed by 6 weeks of relative rest.  Light duty work is not available therefore we will hold her out of work for 6 weeks.  Follow-up for recheck at that time.  We will need advanced imaging if symptoms persist.  Follow-Up Instructions: Return in about 6 weeks (around 05/15/2022).   Orders:  Orders Placed This Encounter  Procedures   Large Joint Inj   XR KNEE 3 VIEW RIGHT   No orders of the defined types were placed in this encounter.     Procedures: Large Joint Inj: R knee on 04/03/2022 1:27 PM Indications: pain Details: 22 G needle  Arthrogram: No  Medications: 40 mg methylPREDNISolone acetate 40 MG/ML; 2 mL lidocaine 1 %; 2 mL bupivacaine 0.5 % Consent was given by the patient. Patient was prepped and draped in the usual sterile fashion.     Clinical Data: No additional findings.   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  She is pulling a palpation and twisted her knee and sharp pain on the medial side.  She now experiences constant pain on the medial side with weightbearing and has achiness at rest and nonweightbearing.  Denies any mechanical symptoms.  Denies any significant swelling.  Review of Systems  Constitutional: Negative.   HENT: Negative.    Eyes: Negative.   Respiratory: Negative.    Cardiovascular: Negative.   Endocrine: Negative.    Musculoskeletal: Negative.   Neurological: Negative.   Hematological: Negative.   Psychiatric/Behavioral: Negative.    All other systems reviewed and are negative.    Objective: Vital Signs: There were no vitals taken for this visit.  Physical Exam Vitals and nursing note reviewed.  Constitutional:      Appearance: She is well-developed.  HENT:     Head: Normocephalic and atraumatic.  Pulmonary:     Effort: Pulmonary effort is normal.  Abdominal:     Palpations: Abdomen is soft.  Musculoskeletal:     Cervical back: Neck supple.  Skin:    General: Skin is warm.     Capillary Refill: Capillary refill takes less than 2 seconds.  Neurological:     Mental Status: She is alert and oriented to person, place, and time.  Psychiatric:        Behavior: Behavior normal.        Thought Content: Thought content normal.        Judgment: Judgment normal.     Ortho Exam Examination of right knee shows trace effusion.  Significant medial joint tenderness and pain with McMurray's test.  Range of motion is not limited.  Collaterals and cruciates are stable. Specialty Comments:  No specialty comments available.  Imaging: XR KNEE 3  VIEW RIGHT  Result Date: 04/03/2022 Mild periarticular spurring.  No acute abnormalities.  Well-preserved joint spaces.    PMFS History: There are no problems to display for this patient.  Past Medical History:  Diagnosis Date   Depression    Diabetes mellitus without complication (HCC)    type II   High cholesterol     Family History  Problem Relation Age of Onset   Heart failure Mother    Cancer Father    Breast cancer Paternal Grandmother     Past Surgical History:  Procedure Laterality Date   APPENDECTOMY     BREAST BIOPSY Right    OOPHORECTOMY     right   Social History   Occupational History   Not on file  Tobacco Use   Smoking status: Never   Smokeless tobacco: Never  Substance and Sexual Activity   Alcohol use: No   Drug  use: No   Sexual activity: Not on file

## 2022-05-15 ENCOUNTER — Ambulatory Visit (INDEPENDENT_AMBULATORY_CARE_PROVIDER_SITE_OTHER): Payer: No Typology Code available for payment source | Admitting: Orthopaedic Surgery

## 2022-05-15 DIAGNOSIS — M25561 Pain in right knee: Secondary | ICD-10-CM

## 2022-05-15 NOTE — Progress Notes (Signed)
Office Visit Note   Patient: Teresa Maxwell           Date of Birth: 01-21-1966           MRN: [id number] Visit Date: 05/15/2022              Requested by: Teresa Maxwell., MD [address] [place],  [place] [address] PCP: Teresa Maxwell., MD   Assessment & Plan: Visit Diagnoses:  1. Acute pain of right knee     Plan: Impression is chronic right knee pain concerning for internal derangement.  Had temporary relief from cortisone injection.  This is Workmen's Comp. claim.  Work note provided to be out of work until follow-up to discuss the MRI which we will order today.  Follow-Up Instructions: No follow-ups on file.   Orders:  No orders of the defined types were placed in this encounter.  No orders of the defined types were placed in this encounter.     Procedures: No procedures performed   Clinical Data: No additional findings.   Subjective: Chief Complaint  Patient presents with   Right Knee - Pain    HPI Teresa Maxwell returns today for follow-up of right knee pain.  We saw her about 6 weeks ago.  Cortisone injection was administered at that time which gave her temporary relief at now the pain is unfortunately returned.  Review of Systems  Constitutional: Negative.   HENT: Negative.    Eyes: Negative.   Respiratory: Negative.    Cardiovascular: Negative.   Endocrine: Negative.   Musculoskeletal: Negative.   Neurological: Negative.   Hematological: Negative.   Psychiatric/Behavioral: Negative.    All other systems reviewed and are negative.    Objective: Vital Signs: There were no vitals taken for this visit.  Physical Exam Vitals and nursing note reviewed.  Constitutional:      Appearance: She is well-developed.  HENT:     Head: Atraumatic.     Nose: Nose normal.  Eyes:     Extraocular Movements: Extraocular movements intact.  Cardiovascular:     Pulses: Normal pulses.  Pulmonary:     Effort: Pulmonary effort is normal.  Abdominal:      Palpations: Abdomen is soft.  Musculoskeletal:     Cervical back: Neck supple.  Skin:    General: Skin is warm.     Capillary Refill: Capillary refill takes less than 2 seconds.  Neurological:     Mental Status: She is alert. Mental status is at baseline.  Psychiatric:        Behavior: Behavior normal.        Thought Content: Thought content normal.        Judgment: Judgment normal.     Ortho Exam Examination of right knee shows medial joint line tenderness.  Trace effusion. Specialty Comments:  No specialty comments available.  Imaging: No results found.   PMFS History: Patient Active Problem List   Diagnosis Date Noted   Acute pain of right knee 05/15/2022   Past Medical History:  Diagnosis Date   Depression    Diabetes mellitus without complication (HCC)    type II   High cholesterol     Family History  Problem Relation Age of Onset   Heart failure Mother    Cancer Father    Breast cancer Paternal Grandmother     Past Surgical History:  Procedure Laterality Date   APPENDECTOMY     BREAST BIOPSY Right    OOPHORECTOMY  right   Social History   Occupational History   Not on file  Tobacco Use   Smoking status: Never   Smokeless tobacco: Never  Substance and Sexual Activity   Alcohol use: No   Drug use: No   Sexual activity: Not on file

## 2022-05-18 ENCOUNTER — Encounter: Payer: Self-pay | Admitting: Orthopaedic Surgery

## 2022-05-20 ENCOUNTER — Ambulatory Visit
Admission: RE | Admit: 2022-05-20 | Discharge: 2022-05-20 | Disposition: A | Discharge status: Home / Self Care | Payer: No Typology Code available for payment source | Source: Ambulatory Visit | Attending: Orthopaedic Surgery | Admitting: Orthopaedic Surgery

## 2022-05-20 DIAGNOSIS — M25561 Pain in right knee: Secondary | ICD-10-CM

## 2022-05-29 ENCOUNTER — Ambulatory Visit (INDEPENDENT_AMBULATORY_CARE_PROVIDER_SITE_OTHER): Payer: No Typology Code available for payment source | Admitting: Orthopaedic Surgery

## 2022-05-29 DIAGNOSIS — S83241A Other tear of medial meniscus, current injury, right knee, initial encounter: Secondary | ICD-10-CM | POA: Diagnosis not present

## 2022-05-29 NOTE — Progress Notes (Signed)
Office Visit Note   Patient: Teresa Maxwell           Date of Birth: [birth year]           MRN: [id number] Visit Date: 05/29/2022              Requested by: Cleatis Polka., MD [address] [place],  [place] [address] PCP: Cleatis Polka., MD   Assessment & Plan: Visit Diagnoses:  1. Acute medial meniscus tear of right knee, initial encounter     Plan: Impression is acute right knee medial meniscal tear in the mid body.  I reviewed the MRI in detail and this does correspond with her symptoms.  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  Based on these findings I recommended arthroscopic partial medial meniscectomy.  Risk benefits prognosis reviewed with the patient.  Questions encouraged and answered.  Out of work until 6 weeks after surgery.  We will need approval from [employer]. for surgery.  Follow-Up Instructions: No follow-ups on file.   Orders:  No orders of the defined types were placed in this encounter.  No orders of the defined types were placed in this encounter.     Procedures: No procedures performed   Clinical Data: No additional findings.   Subjective: Chief Complaint  Patient presents with   Right Knee - Pain    HPI Lilybelle returns today to discuss right knee MRI scan.  Denies any changes in symptoms.  Review of Systems  Constitutional: Negative.   HENT: Negative.    Eyes: Negative.   Respiratory: Negative.    Cardiovascular: Negative.   Endocrine: Negative.   Musculoskeletal: Negative.   Neurological: Negative.   Hematological: Negative.   Psychiatric/Behavioral: Negative.    All other systems reviewed and are negative.    Objective: Vital Signs: There were no vitals taken for this visit.  Physical Exam Vitals and nursing note reviewed.  Constitutional:      Appearance: She is well-developed.  HENT:     Head: Normocephalic and atraumatic.  Pulmonary:     Effort:  Pulmonary effort is normal.  Abdominal:     Palpations: Abdomen is soft.  Musculoskeletal:     Cervical back: Neck supple.  Skin:    General: Skin is warm.     Capillary Refill: Capillary refill takes less than 2 seconds.  Neurological:     Mental Status: She is alert and oriented to person, place, and time.  Psychiatric:        Behavior: Behavior normal.        Thought Content: Thought content normal.        Judgment: Judgment normal.     Ortho Exam Examination of right knee shows exquisite medial joint line tenderness.  Positive McMurray.  Collaterals and cruciates are stable. Specialty Comments:  No specialty comments available.  Imaging: No results found.   PMFS History: Patient Active Problem List   Diagnosis Date Noted   Acute medial meniscus tear of right knee 05/29/2022   Acute pain of right knee 05/15/2022   Past Medical History:  Diagnosis Date   Depression    Diabetes mellitus without complication (HCC)    type II   High cholesterol     Family History  Problem Relation Age of Onset   Heart failure Mother    Cancer Father    Breast cancer Paternal Grandmother     Past Surgical History:  Procedure Laterality Date   APPENDECTOMY     BREAST BIOPSY Right    OOPHORECTOMY     right   Social History   Occupational History   Not on file  Tobacco Use   Smoking status: Never   Smokeless tobacco: Never  Substance and Sexual Activity   Alcohol use: No   Drug use: No   Sexual activity: Not on file

## 2022-06-29 ENCOUNTER — Encounter: Payer: Self-pay | Admitting: Orthopaedic Surgery

## 2022-07-16 ENCOUNTER — Telehealth: Payer: Self-pay | Admitting: Orthopaedic Surgery

## 2022-07-16 NOTE — Telephone Encounter (Signed)
Pt called stating per last visit Dr Erlinda Hong was sending a referral for pt to have physical therapy. Please call pt at [phone].

## 2022-07-16 NOTE — Telephone Encounter (Signed)
Yes if they'll approve

## 2022-07-18 ENCOUNTER — Other Ambulatory Visit: Payer: Self-pay

## 2022-07-18 DIAGNOSIS — S83241A Other tear of medial meniscus, current injury, right knee, initial encounter: Secondary | ICD-10-CM

## 2023-01-20 ENCOUNTER — Encounter (HOSPITAL_COMMUNITY): Payer: Self-pay

## 2023-01-20 ENCOUNTER — Inpatient Hospital Stay (HOSPITAL_COMMUNITY): Payer: 59

## 2023-01-20 ENCOUNTER — Inpatient Hospital Stay (HOSPITAL_COMMUNITY)
Admission: EM | Admit: 2023-01-20 | Discharge: 2023-01-21 | DRG: 074 | Disposition: A | Discharge status: Home / Self Care | Payer: 59 | Attending: Neurology | Admitting: Neurology

## 2023-01-20 ENCOUNTER — Other Ambulatory Visit: Payer: Self-pay

## 2023-01-20 ENCOUNTER — Emergency Department (HOSPITAL_COMMUNITY): Payer: 59

## 2023-01-20 DIAGNOSIS — G510 Bell's palsy: Secondary | ICD-10-CM | POA: Diagnosis present

## 2023-01-20 DIAGNOSIS — E7800 Pure hypercholesterolemia, unspecified: Secondary | ICD-10-CM | POA: Diagnosis present

## 2023-01-20 DIAGNOSIS — I6381 Other cerebral infarction due to occlusion or stenosis of small artery: Secondary | ICD-10-CM | POA: Diagnosis present

## 2023-01-20 DIAGNOSIS — Z975 Presence of (intrauterine) contraceptive device: Secondary | ICD-10-CM

## 2023-01-20 DIAGNOSIS — Z87892 Personal history of anaphylaxis: Secondary | ICD-10-CM | POA: Diagnosis not present

## 2023-01-20 DIAGNOSIS — I6389 Other cerebral infarction: Secondary | ICD-10-CM

## 2023-01-20 DIAGNOSIS — Z91018 Allergy to other foods: Secondary | ICD-10-CM

## 2023-01-20 DIAGNOSIS — Z91010 Allergy to peanuts: Secondary | ICD-10-CM | POA: Diagnosis not present

## 2023-01-20 DIAGNOSIS — Z8673 Personal history of transient ischemic attack (TIA), and cerebral infarction without residual deficits: Secondary | ICD-10-CM | POA: Diagnosis not present

## 2023-01-20 DIAGNOSIS — F32A Depression, unspecified: Secondary | ICD-10-CM | POA: Diagnosis present

## 2023-01-20 DIAGNOSIS — Z9282 Status post administration of tPA (rtPA) in a different facility within the last 24 hours prior to admission to current facility: Secondary | ICD-10-CM

## 2023-01-20 DIAGNOSIS — Z803 Family history of malignant neoplasm of breast: Secondary | ICD-10-CM | POA: Diagnosis not present

## 2023-01-20 DIAGNOSIS — Z7982 Long term (current) use of aspirin: Secondary | ICD-10-CM | POA: Diagnosis not present

## 2023-01-20 DIAGNOSIS — E1165 Type 2 diabetes mellitus with hyperglycemia: Secondary | ICD-10-CM | POA: Diagnosis present

## 2023-01-20 DIAGNOSIS — Z79899 Other long term (current) drug therapy: Secondary | ICD-10-CM | POA: Diagnosis not present

## 2023-01-20 DIAGNOSIS — Z7984 Long term (current) use of oral hypoglycemic drugs: Secondary | ICD-10-CM | POA: Diagnosis not present

## 2023-01-20 DIAGNOSIS — Z8249 Family history of ischemic heart disease and other diseases of the circulatory system: Secondary | ICD-10-CM | POA: Diagnosis not present

## 2023-01-20 DIAGNOSIS — I639 Cerebral infarction, unspecified: Secondary | ICD-10-CM | POA: Diagnosis present

## 2023-01-20 DIAGNOSIS — I10 Essential (primary) hypertension: Secondary | ICD-10-CM | POA: Diagnosis present

## 2023-01-20 LAB — CBC
HCT: 39.5 % (ref 36.0–46.0)
Hemoglobin: 13 g/dL (ref 12.0–15.0)
MCH: 26.5 pg (ref 26.0–34.0)
MCHC: 32.9 g/dL (ref 30.0–36.0)
MCV: 80.6 fL (ref 80.0–100.0)
Platelets: 335 10*3/uL (ref 150–400)
RBC: 4.9 MIL/uL (ref 3.87–5.11)
RDW: 14.1 % (ref 11.5–15.5)
WBC: 8.3 10*3/uL (ref 4.0–10.5)
nRBC: 0 % (ref 0.0–0.2)

## 2023-01-20 LAB — RAPID URINE DRUG SCREEN, HOSP PERFORMED
Amphetamines: NOT DETECTED
Barbiturates: NOT DETECTED
Benzodiazepines: NOT DETECTED
Cocaine: NOT DETECTED
Opiates: NOT DETECTED
Tetrahydrocannabinol: NOT DETECTED

## 2023-01-20 LAB — ECHOCARDIOGRAM COMPLETE
AR max vel: 2.8 cm2
AV Peak grad: 6.6 mmHg
Ao pk vel: 1.28 m/s
Area-P 1/2: 3.83 cm2
Height: 68 in
S' Lateral: 2.6 cm
Weight: 2800 oz

## 2023-01-20 LAB — COMPREHENSIVE METABOLIC PANEL
ALT: 22 U/L (ref 0–44)
AST: 20 U/L (ref 15–41)
Albumin: 4.1 g/dL (ref 3.5–5.0)
Alkaline Phosphatase: 71 U/L (ref 38–126)
Anion gap: 8 (ref 5–15)
BUN: 16 mg/dL (ref 6–20)
CO2: 25 mmol/L (ref 22–32)
Calcium: 9.5 mg/dL (ref 8.9–10.3)
Chloride: 105 mmol/L (ref 98–111)
Creatinine, Ser: 0.84 mg/dL (ref 0.44–1.00)
GFR, Estimated: >60 mL/min (ref >60)
Glucose, Bld: 123 mg/dL — ABNORMAL HIGH (ref 70–99)
Potassium: 4.1 mmol/L (ref 3.5–5.1)
Sodium: 138 mmol/L (ref 135–145)
Total Bilirubin: 0.6 mg/dL (ref 0.3–1.2)
Total Protein: 7 g/dL (ref 6.5–8.1)

## 2023-01-20 LAB — I-STAT CHEM 8, ED
BUN: 18 mg/dL (ref 6–20)
Calcium, Ion: 1.26 mmol/L (ref 1.15–1.40)
Chloride: 104 mmol/L (ref 98–111)
Creatinine, Ser: 0.7 mg/dL (ref 0.44–1.00)
Glucose, Bld: 116 mg/dL — ABNORMAL HIGH (ref 70–99)
HCT: 40 % (ref 36.0–46.0)
Hemoglobin: 13.6 g/dL (ref 12.0–15.0)
Potassium: 4.1 mmol/L (ref 3.5–5.1)
Sodium: 140 mmol/L (ref 135–145)
TCO2: 27 mmol/L (ref 22–32)

## 2023-01-20 LAB — GLUCOSE, CAPILLARY
Glucose-Capillary: 164 mg/dL — ABNORMAL HIGH (ref 70–99)
Glucose-Capillary: 134 mg/dL — ABNORMAL HIGH (ref 70–99)
Glucose-Capillary: 164 mg/dL — ABNORMAL HIGH (ref 70–99)
Glucose-Capillary: 153 mg/dL — ABNORMAL HIGH (ref 70–99)

## 2023-01-20 LAB — DIFFERENTIAL
Abs Immature Granulocytes: 0.01 10*3/uL (ref 0.00–0.07)
Basophils Absolute: 0.1 10*3/uL (ref 0.0–0.1)
Basophils Relative: 1 %
Eosinophils Absolute: 0.3 10*3/uL (ref 0.0–0.5)
Eosinophils Relative: 4 %
Immature Granulocytes: 0 %
Lymphocytes Relative: 51 %
Lymphs Abs: 4.3 10*3/uL — ABNORMAL HIGH (ref 0.7–4.0)
Monocytes Absolute: 0.6 10*3/uL (ref 0.1–1.0)
Monocytes Relative: 7 %
Neutro Abs: 3.1 10*3/uL (ref 1.7–7.7)
Neutrophils Relative %: 37 %

## 2023-01-20 LAB — PROTIME-INR
INR: 1 (ref 0.8–1.2)
Prothrombin Time: 12.9 seconds (ref 11.4–15.2)

## 2023-01-20 LAB — LIPID PANEL
Cholesterol: 147 mg/dL (ref 0–200)
HDL: 57 mg/dL (ref >40)
LDL Cholesterol: 62 mg/dL (ref 0–99)
Total CHOL/HDL Ratio: 2.6 RATIO
Triglycerides: 141 mg/dL (ref <150)
VLDL: 28 mg/dL (ref 0–40)

## 2023-01-20 LAB — ETHANOL: Alcohol, Ethyl (B): <10 mg/dL (ref <10)

## 2023-01-20 LAB — I-STAT BETA HCG BLOOD, ED (MC, WL, AP ONLY): I-stat hCG, quantitative: <5 m[IU]/mL (ref <5)

## 2023-01-20 LAB — TSH: TSH: 2.774 u[IU]/mL (ref 0.350–4.500)

## 2023-01-20 LAB — HIV ANTIBODY (ROUTINE TESTING W REFLEX): HIV Screen 4th Generation wRfx: NONREACTIVE

## 2023-01-20 LAB — HEMOGLOBIN A1C
Hgb A1c MFr Bld: 6.7 % — ABNORMAL HIGH (ref 4.8–5.6)
Mean Plasma Glucose: 145.59 mg/dL

## 2023-01-20 LAB — CBG MONITORING, ED: Glucose-Capillary: 124 mg/dL — ABNORMAL HIGH (ref 70–99)

## 2023-01-20 LAB — MRSA NEXT GEN BY PCR, NASAL: MRSA by PCR Next Gen: NOT DETECTED

## 2023-01-20 LAB — APTT: aPTT: 29 seconds (ref 24–36)

## 2023-01-20 MED ORDER — PREDNISONE 50 MG PO TABS
60.0000 mg | ORAL_TABLET | Freq: Every day | ORAL | Status: DC
  Administered 2023-01-20 – 2023-01-21 (×2): 60 mg via ORAL
  Filled 2023-01-20 (×2): qty 1

## 2023-01-20 MED ORDER — TENECTEPLASE FOR STROKE
0.2500 mg/kg | PACK | Freq: Once | INTRAVENOUS | Status: AC
  Administered 2023-01-20: 20 mg via INTRAVENOUS
  Filled 2023-01-20: qty 10

## 2023-01-20 MED ORDER — CHLORHEXIDINE GLUCONATE CLOTH 2 % EX PADS
6.0000 | MEDICATED_PAD | Freq: Every day | CUTANEOUS | Status: DC
  Administered 2023-01-20 – 2023-01-21 (×3): 6 via TOPICAL

## 2023-01-20 MED ORDER — INSULIN ASPART 100 UNIT/ML IJ SOLN
0.0000 [IU] | INTRAMUSCULAR | Status: DC
  Administered 2023-01-20: 2 [IU] via SUBCUTANEOUS

## 2023-01-20 MED ORDER — ACETAMINOPHEN 650 MG RE SUPP: 650.0000 mg | RECTAL | Status: DC | PRN

## 2023-01-20 MED ORDER — VALACYCLOVIR HCL 500 MG PO TABS
1000.0000 mg | ORAL_TABLET | Freq: Three times a day (TID) | ORAL | Status: DC
  Administered 2023-01-20 – 2023-01-21 (×4): 1000 mg via ORAL
  Filled 2023-01-20 (×6): qty 2

## 2023-01-20 MED ORDER — SENNOSIDES-DOCUSATE SODIUM 8.6-50 MG PO TABS: 1.0000 | ORAL_TABLET | Freq: Every evening | ORAL | Status: DC | PRN

## 2023-01-20 MED ORDER — POLYVINYL ALCOHOL 1.4 % OP SOLN
1.0000 [drp] | Freq: Three times a day (TID) | OPHTHALMIC | Status: DC
  Administered 2023-01-20 – 2023-01-21 (×4): 1 [drp] via OPHTHALMIC
  Filled 2023-01-20: qty 15

## 2023-01-20 MED ORDER — EMPAGLIFLOZIN 25 MG PO TABS
25.0000 mg | ORAL_TABLET | Freq: Every day | ORAL | Status: DC
  Administered 2023-01-20 – 2023-01-21 (×2): 25 mg via ORAL
  Filled 2023-01-20 (×2): qty 1

## 2023-01-20 MED ORDER — ONDANSETRON HCL 4 MG/2ML IJ SOLN
4.0000 mg | Freq: Four times a day (QID) | INTRAMUSCULAR | Status: DC | PRN
  Administered 2023-01-20: 4 mg via INTRAVENOUS
  Filled 2023-01-20: qty 2

## 2023-01-20 MED ORDER — INSULIN ASPART 100 UNIT/ML IJ SOLN
0.0000 [IU] | Freq: Three times a day (TID) | INTRAMUSCULAR | Status: DC
  Administered 2023-01-20 (×2): 2 [IU] via SUBCUTANEOUS

## 2023-01-20 MED ORDER — ACETAMINOPHEN 325 MG PO TABS: 650.0000 mg | ORAL_TABLET | ORAL | Status: DC | PRN

## 2023-01-20 MED ORDER — ESCITALOPRAM OXALATE 10 MG PO TABS
20.0000 mg | ORAL_TABLET | Freq: Every day | ORAL | Status: DC
  Administered 2023-01-20: 20 mg via ORAL
  Filled 2023-01-20: qty 2

## 2023-01-20 MED ORDER — PANTOPRAZOLE SODIUM 40 MG IV SOLR: 40.0000 mg | Freq: Every day | INTRAVENOUS | Status: DC

## 2023-01-20 MED ORDER — STROKE: EARLY STAGES OF RECOVERY BOOK
Freq: Once | Status: AC
  Filled 2023-01-20: qty 1

## 2023-01-20 MED ORDER — SODIUM CHLORIDE 0.9 % IV SOLN: INTRAVENOUS | Status: DC

## 2023-01-20 MED ORDER — VITAMIN D 25 MCG (1000 UNIT) PO TABS
2000.0000 [IU] | ORAL_TABLET | Freq: Every day | ORAL | Status: DC
  Administered 2023-01-20 – 2023-01-21 (×2): 2000 [IU] via ORAL
  Filled 2023-01-20 (×2): qty 2

## 2023-01-20 MED ORDER — ACETAMINOPHEN 160 MG/5ML PO SOLN: 650.0000 mg | ORAL | Status: DC | PRN

## 2023-01-20 MED ORDER — SODIUM CHLORIDE 0.9% FLUSH
3.0000 mL | Freq: Once | INTRAVENOUS | Status: AC
  Administered 2023-01-20: 3 mL via INTRAVENOUS

## 2023-01-20 MED ORDER — IOHEXOL 350 MG/ML SOLN
60.0000 mL | Freq: Once | INTRAVENOUS | Status: AC | PRN
  Administered 2023-01-20: 60 mL via INTRAVENOUS

## 2023-01-20 MED ORDER — ORAL CARE MOUTH RINSE: 15.0000 mL | OROMUCOSAL | Status: DC | PRN

## 2023-01-20 MED ORDER — PANTOPRAZOLE SODIUM 40 MG PO TBEC
40.0000 mg | DELAYED_RELEASE_TABLET | Freq: Every day | ORAL | Status: DC
  Administered 2023-01-21: 40 mg via ORAL
  Filled 2023-01-20: qty 1

## 2023-01-20 MED ORDER — ROSUVASTATIN CALCIUM 20 MG PO TABS
40.0000 mg | ORAL_TABLET | Freq: Every day | ORAL | Status: DC
  Administered 2023-01-21: 40 mg via ORAL
  Filled 2023-01-20: qty 2

## 2023-01-20 NOTE — Code Documentation (Signed)
Stroke Response Nurse Documentation Code Documentation  Teresa Maxwell is a 57 y.o. female arriving to [facility]  via [employer] [facility] on 4/16 with past medical hx of DM, depression, HLD. On No antithrombotic. Code stroke was activated by ED.   Patient from home where she was LKW at 2350 and now complaining of right facial droop.  Stroke team at the bedside on patient arrival. Labs drawn and patient cleared for CT by Dr. Blinda Leatherwood. Patient to CT with team. NIHSS 1, see documentation for details and code stroke times. Patient with right facial droop on exam. The following imaging was completed:  CT Head and CTA. Patient is a candidate for IV Thrombolytic due to fixed neurological deficit. Patient is not a candidate for IR due to No LVO.   Care Plan: TNKase.   Bedside handoff with ED RN [facility].    Rose Fillers  Rapid Response RN

## 2023-01-20 NOTE — Evaluation (Signed)
Physical Therapy Evaluation Patient Details Name: Teresa Maxwell MRN: [id number] DOB: 13-Aug-1966 Today's Date: 01/20/2023  History of Present Illness  57 year old female admitted 01/20/23 with right sided facial droop s/p TNK.  Differential dx includes Bells palsy vs CVA. CT head Chronic appearing left basal ganglia lacunar infarct. No acute cortically based infarct or acute intracranial hemorrhage identified.  PMH includes HTN, DM, HLD.  Clinical Impression  Patient presents with mobility close to baseline and independent with bed mobility, transfers and gait including stairs with rail.  She previously working as travel Charity fundraiser.  She does not need skilled PT as noted no deficits.  PT will sign off.       Recommendations for follow up therapy are one component of a multi-disciplinary discharge planning process, led by the attending physician.  Recommendations may be updated based on patient status, additional functional criteria and insurance authorization.  Follow Up Recommendations       Assistance Recommended at Discharge Set up Supervision/Assistance  Patient can return home with the following       Equipment Recommendations None recommended by PT  Recommendations for Other Services       Functional Status Assessment Patient has not had a recent decline in their functional status     Precautions / Restrictions Precautions Precautions: None      Mobility  Bed Mobility Overal bed mobility: Independent                  Transfers Overall transfer level: Independent                      Ambulation/Gait Ambulation/Gait assistance: Independent Gait Distance (Feet): 200 Feet   Gait Pattern/deviations: WFL(Within Functional Limits)          Stairs Stairs: Yes Stairs assistance: Supervision Stair Management: One rail Right, Alternating pattern, Forwards Number of Stairs: 4    Wheelchair Mobility    Modified Rankin (Stroke Patients Only) Modified  Rankin (Stroke Patients Only) Pre-Morbid Rankin Score: No symptoms Modified Rankin: Slight disability     Balance Overall balance assessment: Independent                               Standardized Balance Assessment Standardized Balance Assessment : Dynamic Gait Index   Dynamic Gait Index Level Surface: Normal Change in Gait Speed: Normal Gait with Horizontal Head Turns: Normal Gait with Vertical Head Turns: Normal Gait and Pivot Turn: Normal Step Over Obstacle: Normal Step Around Obstacles: Normal Steps: Mild Impairment Total Score: 23       Pertinent Vitals/Pain Pain Assessment Pain Assessment: No/denies pain    Home Living Family/patient expects to be discharged to:: Private residence Living Arrangements: Children (son)   Type of Home: House Home Access: Level entry       Home Layout: Two level;Able to live on main level with bedroom/bathroom Home Equipment: Shower seat - built in      Prior Function Prior Level of Function : Independent/Modified Independent             Mobility Comments: works full time as a Training and development officer        Extremity/Trunk Assessment   Upper Extremity Assessment Upper Extremity Assessment: Overall WFL for tasks assessed    Lower Extremity Assessment Lower Extremity Assessment: Overall WFL for tasks assessed       Communication   Communication: No difficulties  Cognition Arousal/Alertness: Awake/alert Behavior During Therapy: WFL for tasks assessed/performed Overall Cognitive Status: Within Functional Limits for tasks assessed                                          General Comments General comments (skin integrity, edema, etc.): mother in the room and supportive, VSS with mobility    Exercises     Assessment/Plan    PT Assessment Patient does not need any further PT services  PT Problem List         PT Treatment Interventions      PT Goals (Current goals can  be found in the Care Plan section)  Acute Rehab PT Goals PT Goal Formulation: All assessment and education complete, DC therapy    Frequency       Co-evaluation               AM-PAC PT "6 Clicks" Mobility  Outcome Measure Help needed turning from your back to your side while in a flat bed without using bedrails?: None Help needed moving from lying on your back to sitting on the side of a flat bed without using bedrails?: None Help needed moving to and from a bed to a chair (including a wheelchair)?: None Help needed standing up from a chair using your arms (e.g., wheelchair or bedside chair)?: None Help needed to walk in hospital room?: None Help needed climbing 3-5 steps with a railing? : None 6 Click Score: 24    End of Session Equipment Utilized During Treatment: Gait belt Activity Tolerance: Patient tolerated treatment well Patient left: in bed;with call bell/phone within reach;with family/visitor present   PT Visit Diagnosis: Other symptoms and signs involving the nervous system (R29.898)    Time: 1610-9604 PT Time Calculation (min) (ACUTE ONLY): 16 min   Charges:   PT Evaluation $PT Eval Low Complexity: 1 Low         Teresa Maxwell, PT Acute Rehabilitation Services Office:[phone] 01/20/2023   Teresa Maxwell 01/20/2023, 4:33 PM

## 2023-01-20 NOTE — Inpatient Diabetes Management (Signed)
Inpatient Diabetes Program Recommendations  AACE/ADA: New Consensus Statement on Inpatient Glycemic Control (2015)  Target Ranges:  Prepandial:   less than 140 mg/dL      Peak postprandial:   less than 180 mg/dL (1-2 hours)      Critically ill patients:  140 - 180 mg/dL   Lab Results  Component Value Date   GLUCAP 134 (H) 01/20/2023   HGBA1C 6.7 (H) 01/20/2023    Latest Reference Range & Units 01/20/23 03:48 01/20/23 08:48 01/20/23 11:53  Glucose-Capillary 70 - 99 mg/dL 409 (H) 811 (H) 914 (H)  (H): Data is abnormally high  Diabetes history: DM2 Outpatient Diabetes medications: Jardiance 25 mg qd, Ozempic 1 mg qweek, Metformin 1 gm bid Current orders for Inpatient glycemic control: Novolog correction 0-9 units q 4 hrs., Jardiance 25 mg qd, Prednisone 60 mg qd  Inpatient Diabetes Program Recommendations:   Received consult regarding inpatient recommendations for glycemic management. Agree with current regimen.  Thank you, Teresa Maxwell. Dorman Calderwood, RN, MSN, CDE  Diabetes Coordinator Inpatient Glycemic Control Team Team Pager [phone] (8am-5pm) 01/20/2023 1:28 PM

## 2023-01-20 NOTE — ED Triage Notes (Signed)
LSW at 2350 01/19/23. Pt began having right sided facial droop and states that right side of face and tongue feels heavy.  Right sided facial droop still present.  Denies weakness or numbness and tingling. No CP SOB.

## 2023-01-20 NOTE — ED Provider Notes (Addendum)
[place] EMERGENCY DEPARTMENT AT [facility] Provider Note   CSN: [id number] Arrival date & time: 01/20/23  0327     History  Chief Complaint  Patient presents with   Facial Droop   Code Stroke    Teresa Maxwell is a 57 y.o. female.  Patient presents to the emergency department for evaluation of facial droop.  Patient reports that she started to notice that the right side of her face was drawing around midnight.  She reports that there is some numbness of the face and tongue.  She has been had some slight drooling, speech is affected because of the facial droop.  No difficulty finding words.  No numbness, tingling or weakness of arms or legs.       Home Medications Prior to Admission medications   Medication Sig Start Date End Date Taking? Authorizing Provider  HYDROcodone-acetaminophen (NORCO/VICODIN) 5-325 MG tablet Take 1 tablet by mouth every 6 (six) hours as needed for moderate pain. 09/17/17   Lattie Haw, MD  ibuprofen (ADVIL,MOTRIN) 600 MG tablet Take 600 mg by mouth every 6 (six) hours as needed.    [provider]  levonorgestrel (MIRENA) 20 MCG/24HR IUD 1 each by Intrauterine route once.    [provider]      Allergies    Other    Review of Systems   Review of Systems  Physical Exam Updated Vital Signs BP (!) 155/91 (BP Location: Right Arm)   Pulse (!) 104   Temp 98.4 F (36.9 C) (Oral)   Resp 20   Ht  (1.727 m)   Wt 79.4 kg   SpO2 100%   BMI 26.61 kg/m  Physical Exam Vitals and nursing note reviewed.  Constitutional:      General: She is not in acute distress.    Appearance: She is well-developed.  HENT:     Head: Normocephalic and atraumatic.     Mouth/Throat:     Mouth: Mucous membranes are moist.  Eyes:     General: Vision grossly intact. Gaze aligned appropriately.     Extraocular Movements: Extraocular movements intact.     Conjunctiva/sclera: Conjunctivae normal.  Cardiovascular:     Rate and  Rhythm: Normal rate and regular rhythm.     Pulses: Normal pulses.     Heart sounds: Normal heart sounds, S1 normal and S2 normal. No murmur heard.    No friction rub. No gallop.  Pulmonary:     Effort: Pulmonary effort is normal. No respiratory distress.     Breath sounds: Normal breath sounds.  Abdominal:     General: Bowel sounds are normal.     Palpations: Abdomen is soft.     Tenderness: There is no abdominal tenderness. There is no guarding or rebound.     Hernia: No hernia is present.  Musculoskeletal:        General: No swelling.     Cervical back: Full passive range of motion without pain, normal range of motion and neck supple. No spinous process tenderness or muscular tenderness. Normal range of motion.     Right lower leg: No edema.     Left lower leg: No edema.  Skin:    General: Skin is warm and dry.     Capillary Refill: Capillary refill takes less than 2 seconds.     Findings: No ecchymosis, erythema, rash or wound.  Neurological:     General: No focal deficit present.     Mental Status: She is  alert and oriented to person, place, and time.     GCS: GCS eye subscore is 4. GCS verbal subscore is 5. GCS motor subscore is 6.     Cranial Nerves: Facial asymmetry present.     Sensory: Sensation is intact.     Motor: Motor function is intact.     Coordination: Coordination is intact.     Comments: Extraocular muscle movement: normal No visual field cut Pupils: equal and reactive both direct and consensual response is normal No nystagmus present  Right facial droop present.    Sensory function is intact to light touch, pinprick Proprioception intact  Grip strength 5/5 symmetric in upper extremities No pronator drift Normal finger to nose bilaterally  Lower extremity strength 5/5 against gravity Normal heel to shin bilaterally  Gait: normal   Psychiatric:        Attention and Perception: Attention normal.        Mood and Affect: Mood normal.        Speech:  Speech normal.        Behavior: Behavior normal.     ED Results / Procedures / Treatments   Labs (all labs ordered are listed, but only abnormal results are displayed) Labs Reviewed  DIFFERENTIAL - Abnormal; Notable for the following components:      Result Value   Lymphs Abs 4.3 (*)    All other components within normal limits  COMPREHENSIVE METABOLIC PANEL - Abnormal; Notable for the following components:   Glucose, Bld 123 (*)    All other components within normal limits  CBG MONITORING, ED - Abnormal; Notable for the following components:   Glucose-Capillary 124 (*)    All other components within normal limits  I-STAT CHEM 8, ED - Abnormal; Notable for the following components:   Glucose, Bld 116 (*)    All other components within normal limits  CBC  ETHANOL  PROTIME-INR  APTT  HIV ANTIBODY (ROUTINE TESTING W REFLEX)  LIPID PANEL  HEMOGLOBIN A1C  CBG MONITORING, ED  I-STAT BETA HCG BLOOD, ED ([facility], [facility], [facility] ONLY)    EKG EKG Interpretation  Date/Time:  Tuesday January 20 2023 04:33:27 EDT Ventricular Rate:  102 PR Interval:  185 QRS Duration: 82 QT Interval:  331 QTC Calculation: 432 R Axis:   -6 Text Interpretation: Sinus tachycardia Low voltage, precordial leads Confirmed by Gilda Crease [phone]) on 01/20/2023 4:39:48 AM  Radiology CT HEAD CODE STROKE WO CONTRAST  Result Date: 01/20/2023 CLINICAL DATA:  Code stroke. 57 year old female. Right side deficits, facial droop. EXAM: CT HEAD WITHOUT CONTRAST TECHNIQUE: Contiguous axial images were obtained from the base of the skull through the vertex without intravenous contrast. RADIATION DOSE REDUCTION: This exam was performed according to the departmental dose-optimization program which includes automated exposure control, adjustment of the mA and/or kV according to patient size and/or use of iterative reconstruction technique. COMPARISON:  None Available. FINDINGS: Brain: Overall normal cerebral volume for age. No  midline shift, mass effect, or evidence of intracranial mass lesion. No ventriculomegaly No acute intracranial hemorrhage identified. Cystic encephalomalacia at the anterior left deep gray nuclei (coronal image 28) compatible with chronic left basal ganglia lacunar infarct. Otherwise gray-white differentiation is within normal limits. No cortically based acute infarct identified. Vascular: Calcified atherosclerosis at the skull base. No suspicious intracranial vascular hyperdensity. Skull: No acute osseous abnormality identified. Sinuses/Orbits: Visualized paranasal sinuses and mastoids are clear. Tympanic cavities are clear. Incidental midline frontal sinus osteoma (series 4, image 30), normal variant. Other: No  gaze deviation. Visualized orbits and scalp soft tissues are within normal limits. ASPECTS [facility] Stroke Program Early CT Score) Total score (0-10 with 10 being normal): 10 IMPRESSION: 1. Chronic appearing left basal ganglia lacunar infarct. No acute cortically based infarct or acute intracranial hemorrhage identified. ASPECTS 10. 2. These results were communicated to Dr. Wilford Corner at 4:09 am on 01/20/2023 by text page via the [facility] messaging system. Electronically Signed   By: Odessa Fleming M.D.   On: 01/20/2023 04:09    Procedures Procedures    Medications Ordered in ED Medications  sodium chloride flush (NS) 0.9 % injection 3 mL (has no administration in time range)   stroke: early stages of recovery book (has no administration in time range)  0.9 %  sodium chloride infusion (has no administration in time range)  acetaminophen (TYLENOL) tablet 650 mg (has no administration in time range)    Or  acetaminophen (TYLENOL) 160 MG/5ML solution 650 mg (has no administration in time range)    Or  acetaminophen (TYLENOL) suppository 650 mg (has no administration in time range)  senna-docusate (Senokot-S) tablet 1 tablet (has no administration in time range)  pantoprazole (PROTONIX) injection 40 mg (has no  administration in time range)  tenecteplase (TNKASE) injection for Stroke 20 mg (20 mg Intravenous Given 01/20/23 0418)  iohexol (OMNIPAQUE) 350 MG/ML injection 60 mL (60 mLs Intravenous Contrast Given 01/20/23 0425)    ED Course/ Medical Decision Making/ A&P                             Medical Decision Making Amount and/or Complexity of Data Reviewed Labs: ordered. Radiology: ordered.  Risk Decision regarding hospitalization.   Differential diagnosis considered includes, but not limited to: Acute stroke; TIA; Bells palsy  Presents to the emergency department for evaluation of acute onset right facial droop.  Patient's examination reveals normal strength of the forehead bilaterally.  She is able to fully close both eyes on command, but occasionally has lag of the right eyelid when blinking.  Patient with drooping of the right side of her face.  Presentation difficult to differentiate acute CVA versus Bell's palsy.  Neurology consultation obtained via a initiating code stroke.  Patient will receive TNK and be admitted by neurology for monitoring.  CRITICAL CARE Performed by: Gilda Crease   Total critical care time: 30 minutes  Critical care time was exclusive of separately billable procedures and treating other patients.  Critical care was necessary to treat or prevent imminent or life-threatening deterioration.  Critical care was time spent personally by me on the following activities: development of treatment plan with patient and/or surrogate as well as nursing, discussions with consultants, evaluation of patient's response to treatment, examination of patient, obtaining history from patient or surrogate, ordering and performing treatments and interventions, ordering and review of laboratory studies, ordering and review of radiographic studies, pulse oximetry and re-evaluation of patient's condition.         Final Clinical Impression(s) / ED Diagnoses Final  diagnoses:  Cerebrovascular accident (CVA), unspecified mechanism    Rx / DC Orders ED Discharge Orders     None         Atalie Oros, Canary Brim, MD 01/20/23 1610    Gilda Crease, MD 01/20/23 [phone]

## 2023-01-20 NOTE — H&P (Addendum)
Stroke Neurology Admission History & Physical   CC: right facial droop  History is obtained from:patient and chart  HPI: Teresa Maxwell is a 57 y.o. female PMH of diabetes, depression, hyperlipidemia presented to the emergency room for sudden onset of right facial weakness and altered sensation on that face.  She reports last known well at 2350 hrs. and then noticed that the right face appeared to be weaker than the left side when she saw herself in the mirror.  She also difficulty containing liquids in her mouth and had drooling fluids when she tried to drink something.  She also noticed that her tongue felt heavy on that side.  She has also noticed some difficulty with her eyelid and her eye on the right feels somewhat different that on the left subjectively. No history of prior strokes No prior history of similar symptoms Denies headache.  Denies visual symptoms.  Denies tingling numbness or weakness in the arms or legs. Denies recent illnesses or sicknesses Denies chest pain, shortness of breath, palpitations.  Denies fevers chills.   LKW: 2350 hrs 01/19/23 IV thrombolysis given?  Yes Premorbid modified Rankin scale (mRS):0   ROS: Full ROS was performed and is negative except as noted in the HPI.  Past Medical History:  Diagnosis Date   Depression    Diabetes mellitus without complication    type II   High cholesterol      Family History  Problem Relation Age of Onset   Heart failure Mother    Cancer Father    Breast cancer Paternal Grandmother      Social History:   reports that she has never smoked. She has never used smokeless tobacco. She reports that she does not drink alcohol and does not use drugs.  Medications  Current Facility-Administered Medications:    sodium chloride flush (NS) 0.9 % injection 3 mL, 3 mL, Intravenous, Once, Pollina, Canary Brim, MD   tenecteplase [facility]) injection for Stroke 20 mg, 0.25 mg/kg, Intravenous, Once, Milon Dikes,  MD  Current Outpatient Medications:    HYDROcodone-acetaminophen (NORCO/VICODIN) 5-325 MG tablet, Take 1 tablet by mouth every 6 (six) hours as needed for moderate pain., Disp: 10 tablet, Rfl: 0   ibuprofen (ADVIL,MOTRIN) 600 MG tablet, Take 600 mg by mouth every 6 (six) hours as needed., Disp: , Rfl:    levonorgestrel (MIRENA) 20 MCG/24HR IUD, 1 each by Intrauterine route once., Disp: , Rfl:    Exam: Current vital signs: BP 128/79 (BP Location: Right Arm)   Pulse 95   Temp 98.1 F (36.7 C)   Resp 17   Ht  (1.727 m)   Wt 79.4 kg   SpO2 99%   BMI 26.61 kg/m  Vital signs in last 24 hours: Temp:  [98.1 F (36.7 C)] 98.1 F (36.7 C) (04/16 0341) Pulse Rate:  [95] 95 (04/16 0341) Resp:  [17] 17 (04/16 0341) BP: (128)/(79) 128/79 (04/16 0341) SpO2:  [99 %] 99 % (04/16 0341) Weight:  [79.4 kg] 79.4 kg (04/16 0345) General: Awake alert in no distress HEENT: Normocephalic atraumatic Lungs: Clear Cardiovascular: Regular rate rhythm Abdomen nondistended nontender Extremities warm well-perfused Neurological exam She is awake alert oriented x 3 Her speech is clear Naming comprehension and repetition are intact Cranial nerves: Pupils equal round react light, extraocular movements intact, visual fields full, facial sensation intact, right lower facial weakness on smiling and subtle right nasolabial fold flattening.  She is able to wrinkle her forehead equally on both sides.  Auditory acuity  intact.  Tongue and palate midline. Motor examination with no drift Sensation exam shows intact sensation to light touch on both sides without extinction on double simultaneous stimulation Coordination exam reveals no dysmetria NIH stroke scale-1   Labs I have reviewed labs in epic and the results pertinent to this consultation are: CBC    Component Value Date/Time   WBC 8.3 01/20/2023 0355   RBC 4.90 01/20/2023 0355   HGB 13.6 01/20/2023 0404   HCT 40.0 01/20/2023 0404   PLT 335  01/20/2023 0355   MCV 80.6 01/20/2023 0355   MCH 26.5 01/20/2023 0355   MCHC 32.9 01/20/2023 0355   RDW 14.1 01/20/2023 0355   LYMPHSABS 4.3 (H) 01/20/2023 0355   MONOABS 0.6 01/20/2023 0355   EOSABS 0.3 01/20/2023 0355   BASOSABS 0.1 01/20/2023 0355    CMP     Component Value Date/Time   NA 140 01/20/2023 0404   K 4.1 01/20/2023 0404   CL 104 01/20/2023 0404   CO2 20 (L) 02/08/2022 1601   GLUCOSE 116 (H) 01/20/2023 0404   BUN 18 01/20/2023 0404   CREATININE 0.70 01/20/2023 0404   CALCIUM 9.7 02/08/2022 1601   GFRNONAA >60 02/08/2022 1601    Imaging I have reviewed the images obtained:  CT-head-no acute changes, chronic appearing left basal ganglia infarcts CTA head and neck with no emergent LVO  Assessment: 57 year old with past history as above presenting for sudden onset of right-sided facial weakness along with liquids drooling from her mouth and unable to close her right eye very well.  She is able to wrinkle her forehead on my exam.  This still could be early an incomplete Bell's palsy but given her risk factors, a small brainstem infarct is also very likely.   She was nearing the 4-1/2 hours from last known well when the code stroke was activated.  I discussed the risks benefits and alternatives of IV TNKase and she agreed to proceed. I did inform her that if she had presented with time enough to get an MRI, I would have preferred to do that to ensure that I have a correct diagnosis but given her risk factors and the lack of time being on her side, we had to make a decision on IV TNKase which she agreed to proceed with as above Differential still include a small brainstem infarct versus Bell's palsy.  Plan:  Differentials: Bell's palsy versus acute Ischemic Stroke (Cerebral infarction due to thrombosis of basilar artery) Acuity: Acute Current Suspected Etiology: Small vessel disease Continue Evaluation:  -Admit to: Neurological ICU -Hold Aspirin until 24 hour post IV  thrombolysis (tPA or TNKase) neuroimaging is stable and without evidence of bleeding -Blood pressure control, goal of SYS <180 -MRI/ECHO/A1C/Lipid panel. -Hyperglycemia management per SSI to maintain glucose 140-180mg /dL. -PT/OT/ST therapies and recommendations when able  CNS Close neuromonitoring Recs as above  Right facial weakness -PT OT  RESP No active issues Monitor clinically   CV -goal SBP < 180 -TTE  Hyperlipidemia, unspecified  - Statin for goal LDL < 70  HEME No active issues Monitor CBC   ENDO Type 2 diabetes with hyperglycemia Check A1c SSI  GI/GU No active issues monitor clinically  Fluid/Electrolyte Disorders No active issues. Check BMP in the morning and replete electrolytes as necessary  prophylaxis DVT: SCD-do not use antiplatelets or anticoagulants for at least 24 hours GI: PPI Bowel: Docusate senna  Diet: NPO until cleared by speech  Code Status: Full Code    THE FOLLOWING WERE  PRESENT ON ADMISSION: Acute ischemic stroke  ----- Risks, benefits, alternatives of IV thrombolysis were discussed and family/patient agreed to proceed. CT imaging was reviewed personally prior to IV thrombolysis administration with no evidence of bleed. -- Milon Dikes, MD Neurologist [facility] Pager: [phone]  CRITICAL CARE ATTESTATION Performed by: Milon Dikes, MD Total critical care time: 40 minutes Critical care time was exclusive of separately billable procedures and treating other patients and/or supervising APPs/Residents/Students Critical care was necessary to treat or prevent imminent or life-threatening deterioration. This patient is critically ill and at significant risk for neurological worsening and/or death and care requires constant monitoring. Critical care was time spent personally by me on the following activities: development of treatment plan with patient and/or surrogate as well as nursing, discussions with  consultants, evaluation of patient's response to treatment, examination of patient, obtaining history from patient or surrogate, ordering and performing treatments and interventions, ordering and review of laboratory studies, ordering and review of radiographic studies, pulse oximetry, re-evaluation of patient's condition, participation in multidisciplinary rounds and medical decision making of high complexity in the care of this patient.

## 2023-01-20 NOTE — Progress Notes (Signed)
PT Cancellation Note  Patient Details Name: Teresa Maxwell MRN: [id number] DOB: [birth year]   Cancelled Treatment:    Reason Eval/Treat Not Completed: Active bedrest order; will follow up in case bedrest orders lifted later today.   Elray Mcgregor 01/20/2023, 9:00 AM Sheran Lawless, PT Acute Rehabilitation Services Office:[phone] 01/20/2023

## 2023-01-20 NOTE — ED Notes (Signed)
ED TO INPATIENT HANDOFF REPORT  ED Nurse Name and Phone #: Raquel Sarna [phone]  S Name/Age/Gender Teresa Maxwell 57 y.o. female Room/Bed: 016C/016C  Code Status   Code Status: Full Code  Home/SNF/Other Home Patient oriented to: self, place, time, and situation Is this baseline? Yes   Triage Complete: Triage complete  Chief Complaint Acute ischemic stroke [I63.9]  Triage Note LSW at 2350 01/19/23. Pt began having right sided facial droop and states that right side of face and tongue feels heavy.  Right sided facial droop still present.  Denies weakness or numbness and tingling. No CP SOB.   Allergies Allergies  Allergen Reactions   Peanut-Containing Drug Products Anaphylaxis   Other Itching    Nuts    Level of Care/Admitting Diagnosis ED Disposition     ED Disposition  Admit   Condition  --   Comment  Hospital Area: [facility] [[id number]]  Level of Care: ICU [6]  May admit patient to [facility] or [facility] if equivalent level of care is available:: No  Covid Evaluation: Asymptomatic - no recent exposure (last 10 days) testing not required  Diagnosis: Acute ischemic stroke [360503]  Admitting Physician: Milon Dikes [[id number]]  Attending Physician: Milon Dikes [[id number]]  Certification:: I certify this patient will need inpatient services for at least 2 midnights  Estimated Length of Stay: 2          B Medical/Surgery History Past Medical History:  Diagnosis Date   Depression    Diabetes mellitus without complication    type II   High cholesterol    Past Surgical History:  Procedure Laterality Date   APPENDECTOMY     BREAST BIOPSY Right    OOPHORECTOMY     right     A IV Location/Drains/Wounds Patient Lines/Drains/Airways Status     Active Line/Drains/Airways     Name Placement date Placement time Site Days   Peripheral IV 01/20/23 18 G Left Antecubital 01/20/23  0400  Antecubital  less than 1             Intake/Output Last 24 hours  Intake/Output Summary (Last 24 hours) at 01/20/2023 0447 Last data filed at 01/20/2023 0446 Gross per 24 hour  Intake 10 ml  Output --  Net 10 ml    Labs/Imaging Results for orders placed or performed during the hospital encounter of 01/20/23 (from the past 48 hour(s))  CBG monitoring, ED     Status: Abnormal   Collection Time: 01/20/23  3:48 AM  Result Value Ref Range   Glucose-Capillary 124 (H) 70 - 99 mg/dL    Comment: Glucose reference range applies only to samples taken after fasting for at least 8 hours.  CBC     Status: None   Collection Time: 01/20/23  3:55 AM  Result Value Ref Range   WBC 8.3 4.0 - 10.5 K/uL   RBC 4.90 3.87 - 5.11 MIL/uL   Hemoglobin 13.0 12.0 - 15.0 g/dL   HCT 28.4 13.2 - 44.0 %   MCV 80.6 80.0 - 100.0 fL   MCH 26.5 26.0 - 34.0 pg   MCHC 32.9 30.0 - 36.0 g/dL   RDW 10.2 72.5 - 36.6 %   Platelets 335 150 - 400 K/uL   nRBC 0.0 0.0 - 0.2 %    Comment: Performed at [facility], [address], [place], [place] [address]  Differential     Status: Abnormal   Collection Time: 01/20/23  3:55 AM  Result  Value Ref Range   Neutrophils Relative % 37 %   Neutro Abs 3.1 1.7 - 7.7 K/uL   Lymphocytes Relative 51 %   Lymphs Abs 4.3 (H) 0.7 - 4.0 K/uL   Monocytes Relative 7 %   Monocytes Absolute 0.6 0.1 - 1.0 K/uL   Eosinophils Relative 4 %   Eosinophils Absolute 0.3 0.0 - 0.5 K/uL   Basophils Relative 1 %   Basophils Absolute 0.1 0.0 - 0.1 K/uL   Immature Granulocytes 0 %   Abs Immature Granulocytes 0.01 0.00 - 0.07 K/uL    Comment: Performed at [facility], [address] [place], [place] [address]  Comprehensive metabolic panel     Status: Abnormal   Collection Time: 01/20/23  3:55 AM  Result Value Ref Range   Sodium 138 135 - 145 mmol/L   Potassium 4.1 3.5 - 5.1 mmol/L   Chloride 105 98 - 111 mmol/L   CO2 25 22 - 32 mmol/L   Glucose, Bld 123 (H) 70 - 99 mg/dL    Comment: Glucose reference range  applies only to samples taken after fasting for at least 8 hours.   BUN 16 6 - 20 mg/dL   Creatinine, Ser 6.04 0.44 - 1.00 mg/dL   Calcium 9.5 8.9 - 54.0 mg/dL   Total Protein 7.0 6.5 - 8.1 g/dL   Albumin 4.1 3.5 - 5.0 g/dL   AST 20 15 - 41 U/L   ALT 22 0 - 44 U/L   Alkaline Phosphatase 71 38 - 126 U/L   Total Bilirubin 0.6 0.3 - 1.2 mg/dL   GFR, Estimated >98 >11 mL/min    Comment: (NOTE) Calculated using the CKD-EPI Creatinine Equation (2021)    Anion gap 8 5 - 15    Comment: Performed at [facility], [address], [place], [place] [address]  Ethanol     Status: None   Collection Time: 01/20/23  3:55 AM  Result Value Ref Range   Alcohol, Ethyl (B) <10 <10 mg/dL    Comment: (NOTE) Lowest detectable limit for serum alcohol is 10 mg/dL.  For medical purposes only. Performed at [facility], [address] [place], [place] [address]   I-stat chem 8, ED     Status: Abnormal   Collection Time: 01/20/23  4:04 AM  Result Value Ref Range   Sodium 140 135 - 145 mmol/L   Potassium 4.1 3.5 - 5.1 mmol/L   Chloride 104 98 - 111 mmol/L   BUN 18 6 - 20 mg/dL   Creatinine, Ser 1.30 0.44 - 1.00 mg/dL   Glucose, Bld 865 (H) 70 - 99 mg/dL    Comment: Glucose reference range applies only to samples taken after fasting for at least 8 hours.   Calcium, Ion 1.26 1.15 - 1.40 mmol/L   TCO2 27 22 - 32 mmol/L   Hemoglobin 13.6 12.0 - 15.0 g/dL   HCT 78.4 69.6 - 29.5 %  I-Stat beta hCG blood, ED     Status: None   Collection Time: 01/20/23  4:04 AM  Result Value Ref Range   I-stat hCG, quantitative <5.0 <5 mIU/mL   Comment 3            Comment:   GEST. AGE      CONC.  (mIU/mL)   <=1 WEEK        5 - 50     2 WEEKS       50 - 500  3 WEEKS       100 - 10,000     4 WEEKS     1,000 - 30,000        FEMALE AND NON-PREGNANT FEMALE:     LESS THAN 5 mIU/mL    CT HEAD CODE STROKE WO CONTRAST  Result Date: 01/20/2023 CLINICAL DATA:  Code stroke. 57 year old female. Right side  deficits, facial droop. EXAM: CT HEAD WITHOUT CONTRAST TECHNIQUE: Contiguous axial images were obtained from the base of the skull through the vertex without intravenous contrast. RADIATION DOSE REDUCTION: This exam was performed according to the departmental dose-optimization program which includes automated exposure control, adjustment of the mA and/or kV according to patient size and/or use of iterative reconstruction technique. COMPARISON:  None Available. FINDINGS: Brain: Overall normal cerebral volume for age. No midline shift, mass effect, or evidence of intracranial mass lesion. No ventriculomegaly No acute intracranial hemorrhage identified. Cystic encephalomalacia at the anterior left deep gray nuclei (coronal image 28) compatible with chronic left basal ganglia lacunar infarct. Otherwise gray-white differentiation is within normal limits. No cortically based acute infarct identified. Vascular: Calcified atherosclerosis at the skull base. No suspicious intracranial vascular hyperdensity. Skull: No acute osseous abnormality identified. Sinuses/Orbits: Visualized paranasal sinuses and mastoids are clear. Tympanic cavities are clear. Incidental midline frontal sinus osteoma (series 4, image 30), normal variant. Other: No gaze deviation. Visualized orbits and scalp soft tissues are within normal limits. ASPECTS [facility] Stroke Program Early CT Score) Total score (0-10 with 10 being normal): 10 IMPRESSION: 1. Chronic appearing left basal ganglia lacunar infarct. No acute cortically based infarct or acute intracranial hemorrhage identified. ASPECTS 10. 2. These results were communicated to Dr. Wilford Corner at 4:09 am on 01/20/2023 by text page via the [facility] messaging system. Electronically Signed   By: Odessa Fleming M.D.   On: 01/20/2023 04:09    Pending Labs Unresulted Labs (From admission, onward)     Start     Ordered   01/20/23 0500  Lipid panel  (Labs)  Tomorrow morning,   R       Comments: Fasting    01/20/23  0418   01/20/23 0415  Protime-INR  Once,   STAT        01/20/23 0415   01/20/23 0415  APTT  Once,   STAT        01/20/23 0415   01/20/23 0414  HIV Antibody (routine testing w rflx)  (HIV Antibody (Routine testing w reflex) panel)  Once,   R        01/20/23 0418   01/20/23 0414  Hemoglobin A1c  (Labs)  Once,   R       Comments: To assess prior glycemic control    01/20/23 0418            Vitals/Pain Today's Vitals   01/20/23 0345 01/20/23 0418 01/20/23 0433 01/20/23 0434  BP:  (!) 151/85 (!) 155/91 (!) 155/91  Pulse:  90 97 (!) 104  Resp:  15 16 20   Temp:  98.4 F (36.9 C) 98.4 F (36.9 C) 98.4 F (36.9 C)  TempSrc:    Oral  SpO2:  99% 100% 100%  Weight: 79.4 kg     Height: 5\' 8"  (1.727 m)     PainSc: 3        Isolation Precautions No active isolations  Medications Medications   stroke: early stages of recovery book (has no administration in time range)  0.9 %  sodium chloride infusion (has no administration in  time range)  acetaminophen (TYLENOL) tablet 650 mg (has no administration in time range)    Or  acetaminophen (TYLENOL) 160 MG/5ML solution 650 mg (has no administration in time range)    Or  acetaminophen (TYLENOL) suppository 650 mg (has no administration in time range)  senna-docusate (Senokot-S) tablet 1 tablet (has no administration in time range)  pantoprazole (PROTONIX) injection 40 mg (has no administration in time range)  sodium chloride flush (NS) 0.9 % injection 3 mL (3 mLs Intravenous Given 01/20/23 0446)  tenecteplase (TNKASE) injection for Stroke 20 mg (20 mg Intravenous Given 01/20/23 0418)  iohexol (OMNIPAQUE) 350 MG/ML injection 60 mL (60 mLs Intravenous Contrast Given 01/20/23 0425)    Mobility walks     Focused Assessments Neuro Assessment Handoff:  Swallow screen pass? Yes    NIH Stroke Scale  Dizziness Present: No Headache Present: No Interval:  (15 min) Level of Consciousness (1a.)   : Alert, keenly responsive LOC Questions  (1b. )   : Answers both questions correctly LOC Commands (1c. )   : Performs both tasks correctly Best Gaze (2. )  : Normal Visual (3. )  : No visual loss Facial Palsy (4. )    : Minor paralysis Motor Arm, Left (5a. )   : No drift Motor Arm, Right (5b. ) : No drift Motor Leg, Left (6a. )  : No drift Motor Leg, Right (6b. ) : No drift Limb Ataxia (7. ): Absent Sensory (8. )  : Normal, no sensory loss Best Language (9. )  : No aphasia Dysarthria (10. ): Normal Extinction/Inattention (11.)   : No Abnormality Complete NIHSS TOTAL: 1 Last date known well: 01/19/23 Last time known well: 2350 Neuro Assessment: Exceptions to [facility] Neuro Checks:   Initial (01/20/23 0353)  Has TPA been given? Yes Temp: 98.4 F (36.9 C) (04/16 0434) Temp Source: Oral (04/16 0434) BP: 155/91 (04/16 0434) Pulse Rate: 104 (04/16 0434) If patient is a Neuro Trauma and patient is going to OR before floor call report to 4N Charge nurse: [phone] or [phone]   R Recommendations: See Admitting Provider Note  Report given to:   Additional Notes: Pt a/o x 4 able to ambulate without assistance

## 2023-01-20 NOTE — Progress Notes (Signed)
OT Cancellation Note  Patient Details Name: Teresa Maxwell MRN: [id number] DOB: [birth year]   Cancelled Treatment:    Reason Eval/Treat Not Completed: Active bedrest order (Pt placed on strict bedrest after receiving TNK at 4:13AM 01/20/23. Policy is to be on bedrest for 24 hrs after TNK. Will continue to follow patient and evaluate in 24 hrs or sooner if MD recommends.)  Limmie Patricia, OTR/L,CBIS  Supplemental OT - [facility] and [facility] Secure Chat Preferred   01/20/2023, 8:13 AM

## 2023-01-20 NOTE — Progress Notes (Signed)
PHARMACIST CODE STROKE RESPONSE  Notified to mix TNK at 0413 by Dr. Wilford Corner TNK preparation completed at 0417  TNK dose = 20 mg IV over 5 seconds   Vernard Gambles, PharmD, BCPS  01/20/23 4:20 AM

## 2023-01-20 NOTE — Progress Notes (Signed)
Echocardiogram 2D Echocardiogram has been performed.  Teresa Maxwell 01/20/2023, 12:12 PM

## 2023-01-20 NOTE — Progress Notes (Addendum)
STROKE TEAM PROGRESS NOTE   SUBJECTIVE (INTERVAL HISTORY) Her RN is at the bedside.  She stated that she felt pain at back of right ear at midnight last night. 2am, she got up found right facial droop and not close right eye as good as left eye. She came to ER and confirmed right facial droop and received TNK in case of stroke.    OBJECTIVE Temp:  [98.1 F (36.7 C)-98.4 F (36.9 C)] 98.2 F (36.8 C) (04/16 0700) Pulse Rate:  [87-104] 102 (04/16 0930) Cardiac Rhythm: Normal sinus rhythm (04/16 0515) Resp:  [0-20] 18 (04/16 0930) BP: (117-161)/(58-111) 126/111 (04/16 0930) SpO2:  [96 %-100 %] 97 % (04/16 0930) Weight:  [79.4 kg] 79.4 kg (04/16 0345)  Recent Labs  Lab 01/20/23 0348 01/20/23 0848  GLUCAP 124* 164*   Recent Labs  Lab 01/20/23 0355 01/20/23 0404  NA 138 140  K 4.1 4.1  CL 105 104  CO2 25  --   GLUCOSE 123* 116*  BUN 16 18  CREATININE 0.84 0.70  CALCIUM 9.5  --    Recent Labs  Lab 01/20/23 0355  AST 20  ALT 22  ALKPHOS 71  BILITOT 0.6  PROT 7.0  ALBUMIN 4.1   Recent Labs  Lab 01/20/23 0355 01/20/23 0404  WBC 8.3  --   NEUTROABS 3.1  --   HGB 13.0 13.6  HCT 39.5 40.0  MCV 80.6  --   PLT 335  --    No results for input(s): "CKTOTAL", "CKMB", "CKMBINDEX", "TROPONINI" in the last 168 hours. Recent Labs    01/20/23 0603  LABPROT 12.9  INR 1.0   No results for input(s): "COLORURINE", "LABSPEC", "PHURINE", "GLUCOSEU", "HGBUR", "BILIRUBINUR", "KETONESUR", "PROTEINUR", "UROBILINOGEN", "NITRITE", "LEUKOCYTESUR" in the last 72 hours.  Invalid input(s): "APPERANCEUR"     Component Value Date/Time   CHOL 147 01/20/2023 0500   TRIG 141 01/20/2023 0500   HDL 57 01/20/2023 0500   CHOLHDL 2.6 01/20/2023 0500   VLDL 28 01/20/2023 0500   LDLCALC 62 01/20/2023 0500   Lab Results  Component Value Date   HGBA1C 6.7 (H) 01/20/2023   No results found for: "LABOPIA", "COCAINSCRNUR", "LABBENZ", "AMPHETMU", "THCU", "LABBARB"  Recent Labs  Lab  01/20/23 0355  ETH <10    I have personally reviewed the radiological images below and agree with the radiology interpretations.  CT ANGIO HEAD NECK W WO CM (CODE STROKE)  Result Date: 01/20/2023 CLINICAL DATA:  57 year old female code stroke presentation. Right side deficits. EXAM: CT ANGIOGRAPHY HEAD AND NECK TECHNIQUE: Multidetector CT imaging of the head and neck was performed using the standard protocol during bolus administration of intravenous contrast. Multiplanar CT image reconstructions and MIPs were obtained to evaluate the vascular anatomy. Carotid stenosis measurements (when applicable) are obtained utilizing NASCET criteria, using the distal internal carotid diameter as the denominator. RADIATION DOSE REDUCTION: This exam was performed according to the departmental dose-optimization program which includes automated exposure control, adjustment of the mA and/or kV according to patient size and/or use of iterative reconstruction technique. CONTRAST:  60mL OMNIPAQUE IOHEXOL 350 MG/ML SOLN COMPARISON:  Plain head CT 0401 hours today. FINDINGS: CTA NECK Skeleton: Advanced cervical facet arthropathy, including right side acquired appearing C3-C4 facet ankylosis. Advanced lower cervical disc and endplate degeneration. No acute osseous abnormality identified. Upper chest: Negative. Other neck: Within normal limits. Aortic arch: Normal 3 vessel arch with minimal atherosclerosis. Right carotid system: No significant brachiocephalic or right CCA plaque. Negative right carotid bifurcation and cervical right  ICA. Left carotid system: Minimal left CCA plaque without stenosis. Mild to moderate soft and calcified plaque at the posterior left ICA origin. No significant stenosis. No additional left ICA plaque to the skull base. Vertebral arteries: Negative proximal right subclavian artery and right vertebral artery origin. Right vertebral artery appears codominant and normal to the skull base. Minimal proximal  left subclavian plaque without stenosis. Normal left vertebral artery origin. Codominant left vertebral artery is patent and within normal limits to the skull base. CTA HEAD Posterior circulation: Distal vertebral arteries, PICA origins, and vertebrobasilar junction appear patent with no significant plaque or stenosis. Patent basilar artery without stenosis. Patent SCA and PCA origins, with fetal type left PCA origin. Right posterior communicating artery is also present. Bilateral PCA branches are within normal limits. Anterior circulation: Both ICA siphons are patent. Mild left siphon calcified plaque without stenosis. Normal left ophthalmic and posterior communicating artery origins. Similar minimal right siphon calcified plaque without stenosis. Normal right ophthalmic and posterior communicating artery origins. Patent carotid termini, MCA and ACA origins. Anterior communicating artery might be fenestrated but within normal limits (normal variant series 5, image 110). Bilateral ACA branches are within normal limits. Left MCA M1 segment and bifurcation are patent without stenosis. Right MCA M1 segment and trifurcation are patent without stenosis. Bilateral MCA branches are within normal limits. Venous sinuses: Negative. Anatomic variants: Fetal type left PCA origin. Review of the MIP images confirms the above findings IMPRESSION: 1. Negative for large vessel occlusion. 2. Mild for age atherosclerosis in the head and neck, most pronounced at the Left ICA origin. No significant arterial stenosis. 3. Advanced degenerative cervical spine facet disease. Preliminary report of the above given to Dr. Milon Dikes on 01/20/2023 at 0437 hours. Electronically Signed   By: Odessa Fleming M.D.   On: 01/20/2023 05:29   CT HEAD CODE STROKE WO CONTRAST  Result Date: 01/20/2023 CLINICAL DATA:  Code stroke. 57 year old female. Right side deficits, facial droop. EXAM: CT HEAD WITHOUT CONTRAST TECHNIQUE: Contiguous axial images were  obtained from the base of the skull through the vertex without intravenous contrast. RADIATION DOSE REDUCTION: This exam was performed according to the departmental dose-optimization program which includes automated exposure control, adjustment of the mA and/or kV according to patient size and/or use of iterative reconstruction technique. COMPARISON:  None Available. FINDINGS: Brain: Overall normal cerebral volume for age. No midline shift, mass effect, or evidence of intracranial mass lesion. No ventriculomegaly No acute intracranial hemorrhage identified. Cystic encephalomalacia at the anterior left deep gray nuclei (coronal image 28) compatible with chronic left basal ganglia lacunar infarct. Otherwise gray-white differentiation is within normal limits. No cortically based acute infarct identified. Vascular: Calcified atherosclerosis at the skull base. No suspicious intracranial vascular hyperdensity. Skull: No acute osseous abnormality identified. Sinuses/Orbits: Visualized paranasal sinuses and mastoids are clear. Tympanic cavities are clear. Incidental midline frontal sinus osteoma (series 4, image 30), normal variant. Other: No gaze deviation. Visualized orbits and scalp soft tissues are within normal limits. ASPECTS [facility] Stroke Program Early CT Score) Total score (0-10 with 10 being normal): 10 IMPRESSION: 1. Chronic appearing left basal ganglia lacunar infarct. No acute cortically based infarct or acute intracranial hemorrhage identified. ASPECTS 10. 2. These results were communicated to Dr. Wilford Corner at 4:09 am on 01/20/2023 by text page via the [facility] messaging system. Electronically Signed   By: Odessa Fleming M.D.   On: 01/20/2023 04:09     PHYSICAL EXAM  Temp:  [98.1 F (36.7 C)-98.4 F (36.9 C)]  98.2 F (36.8 C) (04/16 0700) Pulse Rate:  [87-104] 102 (04/16 0930) Resp:  [0-20] 18 (04/16 0930) BP: (117-161)/(58-111) 126/111 (04/16 0930) SpO2:  [96 %-100 %] 97 % (04/16 0930) Weight:  [79.4 kg] 79.4  kg (04/16 0345)  General - Well nourished, well developed, in no apparent distress.  Ophthalmologic - fundi not visualized due to noncooperation.  Cardiovascular - Regular rhythm and rate.  Mental Status -  Level of arousal and orientation to time, place, and person were intact. Language including expression, naming, repetition, comprehension was assessed and found intact. Attention span and concentration were normal. Recent and remote memory were intact. Fund of Knowledge was assessed and was intact.  Cranial Nerves II - XII - II - Visual field intact OU. III, IV, VI - Extraocular movements intact. V - Facial sensation intact bilaterally. VII - mild R upper and lower facial weakness VIII - Hearing & vestibular intact bilaterally. X - Palate elevates symmetrically. XI - Chin turning & shoulder shrug intact bilaterally. XII - Tongue protrusion intact.  Motor Strength - The patient's strength was normal in all extremities and pronator drift was absent.  Bulk was normal and fasciculations were absent.   Motor Tone - Muscle tone was assessed at the neck and appendages and was normal.  Reflexes - The patient's reflexes were symmetrical in all extremities and she had no pathological reflexes.  Sensory - Light touch, temperature/pinprick were assessed and were symmetrical.    Coordination - The patient had normal movements in the hands and feet with no ataxia or dysmetria.  Tremor was absent.  Gait and Station - deferred.   ASSESSMENT/PLAN Teresa Maxwell is a 57 y.o. female with history of DM, HTN, depression admitted for right facial droop. TNK given.    Stroke like symptoms s/p TNK Possible R bell's palsy, but she does have stroke risk factors CT old left BG lacune  CTA head and neck unremarkable, mild left ICA soft plaque MRI  pending 2D Echo  pending LDL 62 HgbA1c 6.7 TSH pending SCDs for VTE prophylaxis aspirin 81 mg daily prior to admission, now on No antithrombotic  within 24h of TNK Put on prednisone 60mg  and valtrex 1g tid for 7 days Ongoing aggressive stroke risk factor management Therapy recommendations:  pending Disposition:  pending  Diabetes Home meds - Jardiance HgbA1c 6.7 goal < 7.0 Controlled CBG monitoring SSI Now on high dose steroids for 7 days, needs close monitoring glucose here and and home  DM education for short term high dose steroids Close PCP follow up  Hypertension Stable BP goal < 180/105 after TNK Long term BP goal normotensive  Hyperlipidemia Home meds:  crestor 40  LDL 62, goal < 70 Now on home crestor Continue statin at discharge  Other Stroke Risk Factors Hx of stroke on CT imaging, silent stroke  Other Active Problems Depression on lexapro  Hospital day # 0  This patient is critically ill due to s/p TNK and at significant risk of neurological worsening, death form bleeding from TNK. This patient's care requires constant monitoring of vital signs, hemodynamics, respiratory and cardiac monitoring, review of multiple databases, neurological assessment, discussion with family, other specialists and medical decision making of high complexity. I spent 40 minutes of neurocritical care time in the care of this patient. I had long discussion with pt at bedside, updated pt current condition, treatment plan and potential prognosis, and answered all the questions. She expressed understanding and appreciation.    Marvel Plan, MD PhD  Stroke Neurology 01/20/2023 11:04 AM    To contact Stroke Continuity provider, please refer to [email or web address]. After hours, contact General Neurology

## 2023-01-20 NOTE — Evaluation (Signed)
Speech Language Pathology Evaluation Patient Details Name: Teresa Maxwell MRN: [id number] DOB: 07/11/1966 Today's Date: 01/20/2023 Time: 5638-7564 SLP Time Calculation (min) (ACUTE ONLY): 14 min  Problem List:  Patient Active Problem List   Diagnosis Date Noted   Acute ischemic stroke 01/20/2023   Acute medial meniscus tear of right knee 05/29/2022   Acute pain of right knee 05/15/2022   Past Medical History:  Past Medical History:  Diagnosis Date   Depression    Diabetes mellitus without complication    type II   High cholesterol    Past Surgical History:  Past Surgical History:  Procedure Laterality Date   APPENDECTOMY     BREAST BIOPSY Right    OOPHORECTOMY     right   HPI:  57 year old female admitted with right sided facial droop. Differential dx includes Bells palsy vs CVA. CT head Chronic appearing left basal ganglia lacunar infarct. No acute cortically based infarct or acute intracranial hemorrhage identified.   Assessment / Plan / Recommendation Clinical Impression  Patient presents with moderate right sided facial and mild lingual deficits in the areas of strength and ROM but otherwise speech is 100% intelligible and language and cognitive function are intact. Note that differential dx includes Bells palsy vs CVA. Plan to f/u for definitive dx to determine best treatment options. OP SLP services recommended.    SLP Assessment  SLP Recommendation/Assessment: Patient needs continued Speech Lanaguage Pathology Services SLP Visit Diagnosis: Dysarthria and anarthria (R47.1)    Recommendations for follow up therapy are one component of a multi-disciplinary discharge planning process, led by the attending physician.  Recommendations may be updated based on patient status, additional functional criteria and insurance authorization.    Follow Up Recommendations  Outpatient SLP          Frequency and Duration min 1 x/week  1 week      SLP Evaluation Cognition   Overall Cognitive Status: Within Functional Limits for tasks assessed Orientation Level: Oriented X4       Comprehension  Auditory Comprehension Overall Auditory Comprehension: Appears within functional limits for tasks assessed Visual Recognition/Discrimination Discrimination: Within Function Limits Reading Comprehension Reading Status: Within funtional limits    Expression Expression Primary Mode of Expression: Verbal Verbal Expression Overall Verbal Expression: Appears within functional limits for tasks assessed   Oral / Motor  Oral Motor/Sensory Function Overall Oral Motor/Sensory Function: Moderate impairment Facial ROM: Reduced right;Suspected CN VII (facial) dysfunction Facial Symmetry: Abnormal symmetry right;Suspected CN VII (facial) dysfunction Facial Strength: Reduced right;Suspected CN VII (facial) dysfunction Facial Sensation: Within Functional Limits Lingual ROM: Within Functional Limits Lingual Symmetry: Abnormal symmetry right;Suspected CN XII (hypoglossal) dysfunction Lingual Strength: Within Functional Limits Lingual Sensation: Within Functional Limits Velum: Within Functional Limits Mandible: Within Functional Limits Motor Speech Overall Motor Speech: Appears within functional limits for tasks assessed           Ferdinand Lango MA, CCC-SLP  Teresa Maxwell 01/20/2023, 10:26 AM

## 2023-01-20 NOTE — ED Notes (Signed)
ED Provider at bedside. 

## 2023-01-21 ENCOUNTER — Inpatient Hospital Stay (HOSPITAL_COMMUNITY): Payer: 59

## 2023-01-21 LAB — GLUCOSE, CAPILLARY: Glucose-Capillary: 97 mg/dL (ref 70–99)

## 2023-01-21 MED ORDER — PREDNISONE 20 MG PO TABS: 60.0000 mg | ORAL_TABLET | Freq: Every day | ORAL | 0 refills | Status: DC

## 2023-01-21 MED ORDER — VALACYCLOVIR HCL 1 G PO TABS: 1000.0000 mg | ORAL_TABLET | Freq: Three times a day (TID) | ORAL | 0 refills | Status: AC

## 2023-01-21 NOTE — Progress Notes (Signed)
OT Cancellation Note  Patient Details Name: Quana Chamberlain MRN: [id number] DOB: Jul 13, 1966   Cancelled Treatment:    Reason Eval/Treat Not Completed: OT screened, no needs identified, will sign off Per PT, patient with no acute needs. OT signing off at this time, please re-consult if further acute needs arise.   Pollyann Glen E. Abdou Stocks, OTR/L Acute Rehabilitation Services [phone]   Cherlyn Cushing 01/21/2023, 7:55 AM

## 2023-01-21 NOTE — Discharge Summary (Addendum)
Stroke Discharge Summary  Patient ID: Teresa Maxwell   MRN: [id number]      DOB: 02-11-1966  Date of Admission: 01/20/2023 Date of Discharge: 01/21/2023  Attending Physician:  Stroke, Md, MD, Stroke MD Patient's PCP:  Sinda Du, MD  DISCHARGE DIAGNOSIS:  Stroke like symptoms s/p TNK Right bell's palsy  Other active Problems: Chronic lacunar infarct DM HTN HLD depression   Allergies as of 01/21/2023       Reactions   Peanut-containing Drug Products Anaphylaxis   Other Itching   Nuts        Medication List     TAKE these medications    aspirin EC 81 MG tablet Take 81 mg by mouth daily. Swallow whole.   cholecalciferol 25 MCG (1000 UNIT) tablet Commonly known as: VITAMIN D3 Take 2,000 Units by mouth daily.   escitalopram 20 MG tablet Commonly known as: LEXAPRO Take 20 mg by mouth at bedtime.   Jardiance 25 MG Tabs tablet Generic drug: empagliflozin Take 25 mg by mouth daily.   metFORMIN 500 MG 24 hr tablet Commonly known as: GLUCOPHAGE-XR Take 1,000 mg by mouth 2 (two) times daily with a meal.   ondansetron 4 MG tablet Commonly known as: ZOFRAN Take 4 mg by mouth every 8 (eight) hours as needed for nausea.   Ozempic (1 MG/DOSE) 4 MG/3ML Sopn Generic drug: Semaglutide (1 MG/DOSE) Inject 1 mg into the skin once a week. Tuesday   predniSONE 20 MG tablet Commonly known as: DELTASONE Take 3 tablets (60 mg total) by mouth daily with breakfast. Start taking on: January 22, 2023   rosuvastatin 40 MG tablet Commonly known as: CRESTOR Take 40 mg by mouth daily.   valACYclovir 1000 MG tablet Commonly known as: VALTREX Take 1 tablet (1,000 mg total) by mouth 3 (three) times daily.        LABORATORY STUDIES CBC    Component Value Date/Time   WBC 8.3 01/20/2023 0355   RBC 4.90 01/20/2023 0355   HGB 13.6 01/20/2023 0404   HCT 40.0 01/20/2023 0404   PLT 335 01/20/2023 0355   MCV 80.6 01/20/2023 0355   MCH 26.5 01/20/2023 0355   MCHC 32.9  01/20/2023 0355   RDW 14.1 01/20/2023 0355   LYMPHSABS 4.3 (H) 01/20/2023 0355   MONOABS 0.6 01/20/2023 0355   EOSABS 0.3 01/20/2023 0355   BASOSABS 0.1 01/20/2023 0355   CMP    Component Value Date/Time   NA 140 01/20/2023 0404   K 4.1 01/20/2023 0404   CL 104 01/20/2023 0404   CO2 25 01/20/2023 0355   GLUCOSE 116 (H) 01/20/2023 0404   BUN 18 01/20/2023 0404   CREATININE 0.70 01/20/2023 0404   CALCIUM 9.5 01/20/2023 0355   PROT 7.0 01/20/2023 0355   ALBUMIN 4.1 01/20/2023 0355   AST 20 01/20/2023 0355   ALT 22 01/20/2023 0355   ALKPHOS 71 01/20/2023 0355   BILITOT 0.6 01/20/2023 0355   GFRNONAA >60 01/20/2023 0355   COAGS Lab Results  Component Value Date   INR 1.0 01/20/2023   Lipid Panel    Component Value Date/Time   CHOL 147 01/20/2023 0500   TRIG 141 01/20/2023 0500   HDL 57 01/20/2023 0500   CHOLHDL 2.6 01/20/2023 0500   VLDL 28 01/20/2023 0500   LDLCALC 62 01/20/2023 0500   HgbA1C  Lab Results  Component Value Date   HGBA1C 6.7 (H) 01/20/2023   Urinalysis    Component Value Date/Time   COLORURINE  YELLOW 02/08/2022 1601   APPEARANCEUR HAZY (A) 02/08/2022 1601   LABSPEC 1.019 02/08/2022 1601   PHURINE 5.0 02/08/2022 1601   GLUCOSEU NEGATIVE 02/08/2022 1601   HGBUR NEGATIVE 02/08/2022 1601   BILIRUBINUR NEGATIVE 02/08/2022 1601   KETONESUR NEGATIVE 02/08/2022 1601   PROTEINUR NEGATIVE 02/08/2022 1601   NITRITE NEGATIVE 02/08/2022 1601   LEUKOCYTESUR MODERATE (A) 02/08/2022 1601   Urine Drug Screen     Component Value Date/Time   LABOPIA NONE DETECTED 01/20/2023 1636   COCAINSCRNUR NONE DETECTED 01/20/2023 1636   LABBENZ NONE DETECTED 01/20/2023 1636   AMPHETMU NONE DETECTED 01/20/2023 1636   THCU NONE DETECTED 01/20/2023 1636   LABBARB NONE DETECTED 01/20/2023 1636    Alcohol Level    Component Value Date/Time   ETH <10 01/20/2023 0355     SIGNIFICANT DIAGNOSTIC STUDIES MR BRAIN WO CONTRAST  Result Date: 01/21/2023 CLINICAL DATA:   57 year old female code stroke presentation yesterday with right side deficits. Status post TNK. EXAM: MRI HEAD WITHOUT CONTRAST TECHNIQUE: Multiplanar, multiecho pulse sequences of the brain and surrounding structures were obtained without intravenous contrast. COMPARISON:  CT head and CTA head and neck yesterday. FINDINGS: Brain: No restricted diffusion to suggest acute infarction. No midline shift, mass effect, evidence of mass lesion, ventriculomegaly, extra-axial collection or acute intracranial hemorrhage. Cervicomedullary junction and pituitary are within normal limits. Chronic lacunar infarct of the inferior left caudate and lentiform as demonstrated by CT. And there is up small chronic linear infarct of the left lateral cerebellum with faint hemosiderin (series 9, image 5 and series 11, image 13). But otherwise normal for age gray and white matter signal. No cortical encephalomalacia identified. No other chronic cerebral blood products. Vascular: Major intracranial vascular flow voids are preserved. Skull and upper cervical spine: Negative visible cervical spine. Visualized bone marrow signal is within normal limits. Sinuses/Orbits: Negative. Other: Visible internal auditory structures have a grossly normal noncontrast appearance. Mastoid air cells are clear. Stylomastoid foramina appear symmetric and within normal limits. Negative visible scalp and face. IMPRESSION: 1. No acute intracranial abnormality. 2. Chronic lacunar infarct in the left basal ganglia, and subtle chronic linear infarct of the left cerebellum with mild hemosiderin. Electronically Signed   By: Odessa Fleming M.D.   On: 01/21/2023 04:13   ECHOCARDIOGRAM COMPLETE  Result Date: 01/20/2023    ECHOCARDIOGRAM REPORT   Patient Name:   Teresa Maxwell Date of Exam: 01/20/2023 Medical Rec #:  [id number]    Height:       68.0 in Accession #:    [id number]   Weight:       175.0 lb Date of Birth:  [birth year]   BSA:          1.931 m Patient Age:    57 years      BP:           117/58 mmHg Patient Gender: F            HR:           94 bpm. Exam Location:  Inpatient Procedure: 2D Echo, Cardiac Doppler and Color Doppler Indications:    Stroke I63.9  History:        Patient has no prior history of Echocardiogram examinations.                 Stroke; Risk Factors:Hypertension.  Sonographer:    Lucendia Herrlich Referring Phys: [id number] ASHISH ARORA IMPRESSIONS  1. Left ventricular ejection fraction, by estimation, is 60 to 65%. The left ventricle  has normal function. The left ventricle has no regional wall motion abnormalities. Left ventricular diastolic parameters were normal.  2. Right ventricular systolic function is normal. The right ventricular size is normal.  3. The mitral valve is normal in structure. No evidence of mitral valve regurgitation. No evidence of mitral stenosis.  4. The aortic valve was not well visualized. Aortic valve regurgitation is not visualized. Comparison(s): No prior Echocardiogram. Conclusion(s)/Recommendation(s): Otherwise normal echocardiogram, with minor abnormalities described in the report. FINDINGS  Left Ventricle: Left ventricular ejection fraction, by estimation, is 60 to 65%. The left ventricle has normal function. The left ventricle has no regional wall motion abnormalities. The left ventricular internal cavity size was small. There is no left ventricular hypertrophy. Left ventricular diastolic parameters were normal. Right Ventricle: The right ventricular size is normal. No increase in right ventricular wall thickness. Right ventricular systolic function is normal. Left Atrium: Left atrial size was normal in size. Right Atrium: Right atrial size was normal in size. Pericardium: There is no evidence of pericardial effusion. Mitral Valve: The mitral valve is normal in structure. No evidence of mitral valve regurgitation. No evidence of mitral valve stenosis. Tricuspid Valve: The tricuspid valve is not well visualized. Tricuspid valve  regurgitation is trivial. No evidence of tricuspid stenosis. Aortic Valve: The aortic valve was not well visualized. Aortic valve regurgitation is not visualized. Aortic valve peak gradient measures 6.6 mmHg. Pulmonic Valve: The pulmonic valve was grossly normal. Pulmonic valve regurgitation is not visualized. No evidence of pulmonic stenosis. Aorta: The aortic root and ascending aorta are structurally normal, with no evidence of dilitation. IAS/Shunts: No atrial level shunt detected by color flow Doppler.  LEFT VENTRICLE PLAX 2D LVIDd:         3.70 cm   Diastology LVIDs:         2.60 cm   LV e' medial:    8.39 cm/s LV PW:         1.00 cm   LV E/e' medial:  8.6 LV IVS:        1.00 cm   LV e' lateral:   12.30 cm/s LVOT diam:     2.10 cm   LV E/e' lateral: 5.8 LV SV:         61 LV SV Index:   32 LVOT Area:     3.46 cm  RIGHT VENTRICLE             IVC RV S prime:     16.00 cm/s  IVC diam: 1.30 cm TAPSE (M-mode): 2.0 cm LEFT ATRIUM             Index        RIGHT ATRIUM          Index LA diam:        3.00 cm 1.55 cm/m   RA Area:     8.80 cm LA Vol (A2C):   46.7 ml 24.18 ml/m  RA Volume:   16.30 ml 8.44 ml/m LA Vol (A4C):   23.9 ml 12.38 ml/m LA Biplane Vol: 35.8 ml 18.54 ml/m  AORTIC VALVE AV Area (Vmax): 2.80 cm AV Vmax:        128.00 cm/s AV Peak Grad:   6.6 mmHg LVOT Vmax:      103.30 cm/s LVOT Vmean:     66.800 cm/s LVOT VTI:       0.177 m  AORTA Ao Root diam: 3.10 cm Ao Asc diam:  2.90 cm MITRAL VALVE MV Area (PHT): 3.83  cm    SHUNTS MV Decel Time: 198 msec    Systemic VTI:  0.18 m MV E velocity: 71.80 cm/s  Systemic Diam: 2.10 cm MV A velocity: 80.50 cm/s MV E/A ratio:  0.89 Riley Lam MD Electronically signed by Riley Lam MD Signature Date/Time: 01/20/2023/1:21:34 PM    Final    CT ANGIO HEAD NECK W WO CM (CODE STROKE)  Result Date: 01/20/2023 CLINICAL DATA:  57 year old female code stroke presentation. Right side deficits. EXAM: CT ANGIOGRAPHY HEAD AND NECK TECHNIQUE:  Multidetector CT imaging of the head and neck was performed using the standard protocol during bolus administration of intravenous contrast. Multiplanar CT image reconstructions and MIPs were obtained to evaluate the vascular anatomy. Carotid stenosis measurements (when applicable) are obtained utilizing NASCET criteria, using the distal internal carotid diameter as the denominator. RADIATION DOSE REDUCTION: This exam was performed according to the departmental dose-optimization program which includes automated exposure control, adjustment of the mA and/or kV according to patient size and/or use of iterative reconstruction technique. CONTRAST:  60mL OMNIPAQUE IOHEXOL 350 MG/ML SOLN COMPARISON:  Plain head CT 0401 hours today. FINDINGS: CTA NECK Skeleton: Advanced cervical facet arthropathy, including right side acquired appearing C3-C4 facet ankylosis. Advanced lower cervical disc and endplate degeneration. No acute osseous abnormality identified. Upper chest: Negative. Other neck: Within normal limits. Aortic arch: Normal 3 vessel arch with minimal atherosclerosis. Right carotid system: No significant brachiocephalic or right CCA plaque. Negative right carotid bifurcation and cervical right ICA. Left carotid system: Minimal left CCA plaque without stenosis. Mild to moderate soft and calcified plaque at the posterior left ICA origin. No significant stenosis. No additional left ICA plaque to the skull base. Vertebral arteries: Negative proximal right subclavian artery and right vertebral artery origin. Right vertebral artery appears codominant and normal to the skull base. Minimal proximal left subclavian plaque without stenosis. Normal left vertebral artery origin. Codominant left vertebral artery is patent and within normal limits to the skull base. CTA HEAD Posterior circulation: Distal vertebral arteries, PICA origins, and vertebrobasilar junction appear patent with no significant plaque or stenosis. Patent  basilar artery without stenosis. Patent SCA and PCA origins, with fetal type left PCA origin. Right posterior communicating artery is also present. Bilateral PCA branches are within normal limits. Anterior circulation: Both ICA siphons are patent. Mild left siphon calcified plaque without stenosis. Normal left ophthalmic and posterior communicating artery origins. Similar minimal right siphon calcified plaque without stenosis. Normal right ophthalmic and posterior communicating artery origins. Patent carotid termini, MCA and ACA origins. Anterior communicating artery might be fenestrated but within normal limits (normal variant series 5, image 110). Bilateral ACA branches are within normal limits. Left MCA M1 segment and bifurcation are patent without stenosis. Right MCA M1 segment and trifurcation are patent without stenosis. Bilateral MCA branches are within normal limits. Venous sinuses: Negative. Anatomic variants: Fetal type left PCA origin. Review of the MIP images confirms the above findings IMPRESSION: 1. Negative for large vessel occlusion. 2. Mild for age atherosclerosis in the head and neck, most pronounced at the Left ICA origin. No significant arterial stenosis. 3. Advanced degenerative cervical spine facet disease. Preliminary report of the above given to Dr. Milon Dikes on 01/20/2023 at 0437 hours. Electronically Signed   By: Odessa Fleming M.D.   On: 01/20/2023 05:29   CT HEAD CODE STROKE WO CONTRAST  Result Date: 01/20/2023 CLINICAL DATA:  Code stroke. 57 year old female. Right side deficits, facial droop. EXAM: CT HEAD WITHOUT CONTRAST TECHNIQUE: Contiguous axial images  were obtained from the base of the skull through the vertex without intravenous contrast. RADIATION DOSE REDUCTION: This exam was performed according to the departmental dose-optimization program which includes automated exposure control, adjustment of the mA and/or kV according to patient size and/or use of iterative reconstruction  technique. COMPARISON:  None Available. FINDINGS: Brain: Overall normal cerebral volume for age. No midline shift, mass effect, or evidence of intracranial mass lesion. No ventriculomegaly No acute intracranial hemorrhage identified. Cystic encephalomalacia at the anterior left deep gray nuclei (coronal image 28) compatible with chronic left basal ganglia lacunar infarct. Otherwise gray-white differentiation is within normal limits. No cortically based acute infarct identified. Vascular: Calcified atherosclerosis at the skull base. No suspicious intracranial vascular hyperdensity. Skull: No acute osseous abnormality identified. Sinuses/Orbits: Visualized paranasal sinuses and mastoids are clear. Tympanic cavities are clear. Incidental midline frontal sinus osteoma (series 4, image 30), normal variant. Other: No gaze deviation. Visualized orbits and scalp soft tissues are within normal limits. ASPECTS [facility] Stroke Program Early CT Score) Total score (0-10 with 10 being normal): 10 IMPRESSION: 1. Chronic appearing left basal ganglia lacunar infarct. No acute cortically based infarct or acute intracranial hemorrhage identified. ASPECTS 10. 2. These results were communicated to Dr. Wilford Corner at 4:09 am on 01/20/2023 by text page via the [facility] messaging system. Electronically Signed   By: Odessa Fleming M.D.   On: 01/20/2023 04:09      HISTORY OF PRESENT ILLNESS Teresa Maxwell is a 57 y.o. female PMH of diabetes, depression, hyperlipidemia presented to the emergency room for sudden onset of right facial weakness and altered sensation on that face.  She reports last known well at 2350 hrs. and then noticed that the right face appeared to be weaker than the left side when she saw herself in the mirror.  She also difficulty containing liquids in her mouth and had drooling fluids when she tried to drink something.  She also noticed that her tongue felt heavy on that side.  She has also noticed some difficulty with her eyelid and her  eye on the right feels somewhat different that on the left subjectively. No history of prior strokes No prior history of similar symptoms Denies headache.  Denies visual symptoms.  Denies tingling numbness or weakness in the arms or legs. Denies recent illnesses or sicknesses Denies chest pain, shortness of breath, palpitations.  Denies fevers chills.    LKW: 2350 hrs 01/19/23 IV thrombolysis given?  Yes Premorbid modified Rankin scale (mRS):0    HOSPITAL COURSE Ms. Joselynne Killam is a 57 y.o. female with history of DM, HTN, depression admitted for right facial droop. TNK given   Stroke like symptoms s/p TNK Likely R bell's palsy CT old left BG lacunar infarct CTA head and neck unremarkable, mild left ICA soft plaque MRI: negative acute, chronic left BG lacunar infarct 2D Echo: EF 60-65% LDL 62 HgbA1c 6.7 TSH 2.774 SCDs for VTE prophylaxis aspirin 81 mg daily prior to admission, continue on discharge Put on prednisone 60mg  and valtrex 1g tid for 7 days Ongoing aggressive stroke risk factor management  Diabetes Home meds - Jardiance HgbA1c 6.7 goal < 7.0 Controlled CBG monitoring SSI Now on high dose steroids for 7 days, needs close monitoring glucose at home; discussed with patient. DM education completed for short term high dose steroids Close PCP follow up   Hypertension Stable Long term BP goal normotensive   Hyperlipidemia Home meds:  crestor 40  LDL 62, goal < 70 Now on home crestor Continue  statin at discharge  Other Stroke Risk Factors Hx of stroke on CT and MRI imaging, silent stroke   Other Active Problems Depression on lexapro   DISCHARGE EXAM Blood pressure 108/66, pulse 89, temperature 98.7 F (37.1 C), temperature source Oral, resp. rate 17, height 5\' 8"  (1.727 m), weight 79.4 kg, SpO2 93 %.   General - Well nourished, well developed, in no apparent distress.   Ophthalmologic - fundi not visualized due to noncooperation.   Cardiovascular -  Regular rhythm and rate.   Mental Status -  Level of arousal and orientation to time, place, and person were intact. Language including expression, naming, repetition, comprehension was assessed and found intact. Attention span and concentration were normal. Recent and remote memory were intact. Fund of Knowledge was assessed and was intact.   Cranial Nerves II - XII - II - Visual field intact OU. III, IV, VI - Extraocular movements intact. V - Facial sensation intact bilaterally. VII - mild R upper and lower facial weakness VIII - Hearing & vestibular intact bilaterally. X - Palate elevates symmetrically. XI - Chin turning & shoulder shrug intact bilaterally. XII - Tongue protrusion intact.   Motor Strength - strength was normal in all extremities and pronator drift was absent.   Motor Tone - Muscle tone was assessed and was normal.   Reflexes - The patient's reflexes were symmetrical in all extremities and she had no pathological reflexes.   Sensory - Light touch, temperature/pinprick were assessed and were symmetrical.     Coordination - The patient had normal movements in the hands and feet with no ataxia or dysmetria.  Tremor was absent.   Gait and Station - deferred.  Discharge Diet       Diet   Diet Carb Modified Fluid consistency: Thin; Room service appropriate? Yes   liquids  DISCHARGE PLAN Disposition:  home aspirin 81 mg daily for secondary stroke prevention Ongoing stroke risk factor control by Primary Care Physician at time of discharge Follow-up PCP Sinda Du, MD in 2 weeks. Follow-up in [facility] in 4 weeks, office to schedule an appointment.   35 minutes were spent preparing discharge.  Marvel Plan, MD PhD Stroke Neurology 01/21/2023 1:32 PM

## 2023-01-21 NOTE — Progress Notes (Signed)
SLP Cancellation Note  Patient Details Name: Teresa Maxwell MRN: [id number] DOB: [birth year]   Cancelled treatment:       Reason Eval/Treat Not Completed: Other (comment). Given negative MRI no need for f/u with SLP after d/c. Clarified with pt and RN. Will sign off.    Alizah Sills, Riley Nearing 01/21/2023, 10:12 AM

## 2023-05-04 ENCOUNTER — Other Ambulatory Visit: Payer: Self-pay | Admitting: Family Medicine

## 2023-05-04 DIAGNOSIS — Z1231 Encounter for screening mammogram for malignant neoplasm of breast: Secondary | ICD-10-CM

## 2023-05-07 ENCOUNTER — Ambulatory Visit
Admission: RE | Admit: 2023-05-07 | Discharge: 2023-05-07 | Disposition: A | Discharge status: Home / Self Care | Payer: BC Managed Care – PPO | Source: Ambulatory Visit

## 2023-05-07 DIAGNOSIS — Z1231 Encounter for screening mammogram for malignant neoplasm of breast: Secondary | ICD-10-CM

## 2023-05-12 ENCOUNTER — Other Ambulatory Visit: Payer: Self-pay | Admitting: Family Medicine

## 2023-05-12 DIAGNOSIS — R928 Other abnormal and inconclusive findings on diagnostic imaging of breast: Secondary | ICD-10-CM

## 2023-05-25 ENCOUNTER — Ambulatory Visit: Admission: RE | Admit: 2023-05-25 | Payer: BC Managed Care – PPO | Source: Ambulatory Visit

## 2023-05-25 ENCOUNTER — Other Ambulatory Visit: Payer: Self-pay | Admitting: Family Medicine

## 2023-05-25 ENCOUNTER — Ambulatory Visit
Admission: RE | Admit: 2023-05-25 | Discharge: 2023-05-25 | Disposition: A | Discharge status: Home / Self Care | Payer: BC Managed Care – PPO | Source: Ambulatory Visit | Attending: Family Medicine | Admitting: Family Medicine

## 2023-05-25 DIAGNOSIS — N6489 Other specified disorders of breast: Secondary | ICD-10-CM

## 2023-05-25 DIAGNOSIS — R928 Other abnormal and inconclusive findings on diagnostic imaging of breast: Secondary | ICD-10-CM

## 2023-05-29 ENCOUNTER — Ambulatory Visit
Admission: RE | Admit: 2023-05-29 | Discharge: 2023-05-29 | Disposition: A | Discharge status: Home / Self Care | Payer: BC Managed Care – PPO | Source: Ambulatory Visit | Attending: Family Medicine | Admitting: Family Medicine

## 2023-05-29 DIAGNOSIS — R928 Other abnormal and inconclusive findings on diagnostic imaging of breast: Secondary | ICD-10-CM

## 2023-05-29 DIAGNOSIS — N6489 Other specified disorders of breast: Secondary | ICD-10-CM

## 2023-05-29 HISTORY — PX: BREAST BIOPSY: SHX20

## 2024-03-23 ENCOUNTER — Other Ambulatory Visit (HOSPITAL_BASED_OUTPATIENT_CLINIC_OR_DEPARTMENT_OTHER): Payer: Self-pay

## 2024-03-23 ENCOUNTER — Other Ambulatory Visit: Payer: Self-pay

## 2024-03-23 MED ORDER — ROSUVASTATIN CALCIUM 40 MG PO TABS
40.0000 mg | ORAL_TABLET | Freq: Every day | ORAL | 2 refills | Status: DC
  Filled 2024-03-23 (×2): qty 90, 90d supply, fill #0

## 2024-03-23 MED ORDER — EMPAGLIFLOZIN 25 MG PO TABS
25.0000 mg | ORAL_TABLET | Freq: Every day | ORAL | 2 refills | Status: DC
  Filled 2024-03-23: qty 90, 90d supply, fill #0

## 2024-03-23 MED ORDER — METFORMIN HCL ER 500 MG PO TB24
1000.0000 mg | ORAL_TABLET | Freq: Two times a day (BID) | ORAL | 1 refills | Status: DC
  Filled 2024-03-23: qty 360, 90d supply, fill #0

## 2024-03-23 MED ORDER — OZEMPIC (1 MG/DOSE) 4 MG/3ML ~~LOC~~ SOPN
1.0000 mg | PEN_INJECTOR | SUBCUTANEOUS | 1 refills | Status: DC
  Filled 2024-04-21: qty 3, 28d supply, fill #0

## 2024-03-23 MED ORDER — ESCITALOPRAM OXALATE 20 MG PO TABS
20.0000 mg | ORAL_TABLET | Freq: Every day | ORAL | 1 refills | Status: DC
  Filled 2024-03-23 (×2): qty 90, 90d supply, fill #0

## 2024-04-21 ENCOUNTER — Other Ambulatory Visit (HOSPITAL_BASED_OUTPATIENT_CLINIC_OR_DEPARTMENT_OTHER): Payer: Self-pay

## 2024-06-15 ENCOUNTER — Other Ambulatory Visit (HOSPITAL_BASED_OUTPATIENT_CLINIC_OR_DEPARTMENT_OTHER): Payer: Self-pay

## 2024-06-15 MED ORDER — OZEMPIC (1 MG/DOSE) 4 MG/3ML ~~LOC~~ SOPN
1.0000 mg | PEN_INJECTOR | SUBCUTANEOUS | 0 refills | Status: DC
  Filled 2024-06-15 – 2024-06-25 (×2): qty 3, 28d supply, fill #0

## 2024-06-25 ENCOUNTER — Other Ambulatory Visit (HOSPITAL_BASED_OUTPATIENT_CLINIC_OR_DEPARTMENT_OTHER): Payer: Self-pay

## 2024-06-25 ENCOUNTER — Other Ambulatory Visit (HOSPITAL_COMMUNITY): Payer: Self-pay

## 2024-07-10 ENCOUNTER — Other Ambulatory Visit (HOSPITAL_BASED_OUTPATIENT_CLINIC_OR_DEPARTMENT_OTHER): Payer: Self-pay

## 2024-07-11 ENCOUNTER — Other Ambulatory Visit (HOSPITAL_BASED_OUTPATIENT_CLINIC_OR_DEPARTMENT_OTHER): Payer: Self-pay

## 2024-07-11 MED ORDER — ESCITALOPRAM OXALATE 20 MG PO TABS
20.0000 mg | ORAL_TABLET | Freq: Every day | ORAL | 0 refills | Status: DC
  Filled 2024-07-11: qty 30, 30d supply, fill #0

## 2024-07-11 MED ORDER — METFORMIN HCL ER 500 MG PO TB24
ORAL_TABLET | ORAL | 0 refills | Status: DC
  Filled 2024-07-11: qty 120, 30d supply, fill #0

## 2024-07-11 MED ORDER — JARDIANCE 25 MG PO TABS
25.0000 mg | ORAL_TABLET | Freq: Every day | ORAL | 0 refills | Status: DC
  Filled 2024-07-11: qty 30, 30d supply, fill #0

## 2024-07-11 MED ORDER — ROSUVASTATIN CALCIUM 40 MG PO TABS
40.0000 mg | ORAL_TABLET | Freq: Every day | ORAL | 0 refills | Status: DC
  Filled 2024-07-11: qty 30, 30d supply, fill #0

## 2024-07-12 ENCOUNTER — Other Ambulatory Visit: Payer: Self-pay | Admitting: Family Medicine

## 2024-07-12 DIAGNOSIS — Z1231 Encounter for screening mammogram for malignant neoplasm of breast: Secondary | ICD-10-CM

## 2024-07-18 ENCOUNTER — Ambulatory Visit
Admission: RE | Admit: 2024-07-18 | Discharge: 2024-07-18 | Disposition: A | Discharge status: Home / Self Care | Attending: Family Medicine | Admitting: Family Medicine

## 2024-07-18 VITALS — BP 145/83 | HR 89 | Temp 98.7°F | Resp 19

## 2024-07-18 DIAGNOSIS — H93A2 Pulsatile tinnitus, left ear: Secondary | ICD-10-CM

## 2024-07-18 HISTORY — DX: Bell's palsy: G51.0

## 2024-07-18 HISTORY — DX: Unspecified asthma, uncomplicated: J45.909

## 2024-07-18 HISTORY — DX: Cerebral infarction, unspecified: I63.9

## 2024-07-18 NOTE — Discharge Instructions (Addendum)
 Advised patient to follow-up with ENT or audiology for ringing of left ear.

## 2024-07-18 NOTE — ED Triage Notes (Addendum)
 [facility] presents [facility] with [facility] pulsatile tinnitus in left ear, fever, fatigue since Thursday. Has been using tyelnol. At home covid neg

## 2024-07-18 NOTE — ED Provider Notes (Signed)
 [facility]    CSN: [id number] Arrival date & time: 07/18/24  1601      History   Chief Complaint Chief Complaint  Patient presents with   Fever    Lightheaded at times, body aches, decreased appetite, fatigue - Entered by patient    HPI Imo Cumbie is a 58 y.o. female.   HPI 58 year old female presents with fever, lightheadedness, body aches, decreased appetite, left ear pulsatile tinnitus, and fatigue.  PMH significant for stroke, T2DM without complication, and depression.  Past Medical History:  Diagnosis Date   Asthma    Bell's palsy    Depression    Diabetes mellitus without complication (HCC)    type II   High cholesterol    Stroke [facility])     Patient Active Problem List   Diagnosis Date Noted   Acute ischemic stroke (HCC) 01/20/2023   Acute medial meniscus tear of right knee 05/29/2022   Acute pain of right knee 05/15/2022    Past Surgical History:  Procedure Laterality Date   APPENDECTOMY     BREAST BIOPSY Right 03/2018   BREAST BIOPSY Right 05/29/2023   MM RT BREAST BX W LOC DEV 1ST LESION IMAGE BX SPEC STEREO GUIDE 05/29/2023 [facility]   OOPHORECTOMY     right    OB History   No obstetric history on file.      Home Medications    Prior to Admission medications   Medication Sig Start Date End Date Taking? Authorizing Provider  aspirin EC 81 MG tablet Take 81 mg by mouth daily. Swallow whole.    [provider]  cholecalciferol  (VITAMIN D3) 25 MCG (1000 UNIT) tablet Take 2,000 Units by mouth daily.    [provider]  empagliflozin  (JARDIANCE ) 25 MG TABS tablet Take 1 tablet (25 mg total) by mouth daily. APPOINTMENT REQUIRED FOR FURTHER REFILLS 07/11/24     escitalopram  (LEXAPRO ) 20 MG tablet Take 20 mg by mouth at bedtime. 07/21/17   [provider]  escitalopram  (LEXAPRO ) 20 MG tablet Take 1 tablet (20 mg total) by mouth daily. APPOINTMENT REQUIRED FOR FURTHER REFILLS Patient not taking: Reported  on 07/18/2024 07/11/24     JARDIANCE  25 MG TABS tablet Take 25 mg by mouth daily.    [provider]  metFORMIN  (GLUCOPHAGE -XR) 500 MG 24 hr tablet Take 1,000 mg by mouth 2 (two) times daily with a meal. 01/30/22 04/19/23  [provider]  metFORMIN  (GLUCOPHAGE -XR) 500 MG 24 hr tablet Take 2 tablets (1,000 mg total) by mouth in the morning AND 2 tablets (1,000 mg total) every evening. Take with meals. APPOINTMENT REQUIRED FOR FURTHER REFILLS. 07/11/24     ondansetron  (ZOFRAN ) 4 MG tablet Take 4 mg by mouth every 8 (eight) hours as needed for nausea. 12/12/22   [provider]  OZEMPIC , 1 MG/DOSE, 4 MG/3ML SOPN Inject 1 mg into the skin once a week. Tuesday    [provider]  predniSONE  (DELTASONE ) 20 MG tablet Take 3 tablets (60 mg total) by mouth daily with breakfast. 01/22/23   Judithe Rocky BROCKS, NP  rosuvastatin  (CRESTOR ) 40 MG tablet Take 40 mg by mouth daily. 01/06/22 04/19/23  [provider]  rosuvastatin  (CRESTOR ) 40 MG tablet Take 1 tablet (40 mg total) by mouth daily. 07/11/24     Semaglutide , 1 MG/DOSE, (OZEMPIC , 1 MG/DOSE,) 4 MG/3ML SOPN Inject 1 mg into the skin once a week. 06/15/24     valACYclovir  (VALTREX ) 1000 MG tablet Take 1 tablet (1,000  mg total) by mouth 3 (three) times daily. 01/21/23   Judithe Rocky BROCKS, NP    Family History Family History  Problem Relation Age of Onset   Heart failure Mother    Cancer Father    Breast cancer Paternal Grandmother     Social History Social History   Tobacco Use   Smoking status: Never   Smokeless tobacco: Never  Substance Use Topics   Alcohol  use: No   Drug use: No     Allergies   Peanut-containing drug products and Other   Review of Systems Review of Systems  Constitutional:  Positive for fatigue and fever.  HENT:         Ringing in left ear  All other systems reviewed and are negative.    Physical Exam Triage Vital Signs ED Triage Vitals  Encounter Vitals Group     BP 07/18/24 1627  (!) 145/83     Girls Systolic BP Percentile --      Girls Diastolic BP Percentile --      Boys Systolic BP Percentile --      Boys Diastolic BP Percentile --      Pulse Rate 07/18/24 1628 89     Resp 07/18/24 1627 19     Temp 07/18/24 1627 98.7 F (37.1 C)     Temp src --      SpO2 07/18/24 1627 98 %     Weight --      Height --      Head Circumference --      Peak Flow --      Pain Score 07/18/24 1624 0     Pain Loc --      Pain Education --      Exclude from Growth Chart --    No data found.  Updated Vital Signs BP (!) 145/83   Pulse 89   Temp 98.7 F (37.1 C)   Resp 19   LMP  (LMP Unknown)   SpO2 98%     Physical Exam Vitals and nursing note reviewed.  Constitutional:      Appearance: Normal appearance. She is obese.  HENT:     Head: Normocephalic and atraumatic.     Right Ear: Tympanic membrane, ear canal and external ear normal.     Left Ear: Ear canal and external ear normal.     Ears:     Comments: Left TM: Mildly red rimmed    Mouth/Throat:     Mouth: Mucous membranes are moist.     Pharynx: Oropharynx is clear.  Eyes:     Extraocular Movements: Extraocular movements intact.     Conjunctiva/sclera: Conjunctivae normal.     Pupils: Pupils are equal, round, and reactive to light.  Cardiovascular:     Rate and Rhythm: Normal rate and regular rhythm.     Pulses: Normal pulses.     Heart sounds: Normal heart sounds.  Pulmonary:     Effort: Pulmonary effort is normal.     Breath sounds: Normal breath sounds. No wheezing, rhonchi or rales.  Musculoskeletal:        General: Normal range of motion.     Cervical back: Normal range of motion and neck supple.  Skin:    General: Skin is warm and dry.  Neurological:     General: No focal deficit present.     Mental Status: She is alert and oriented to person, place, and time.      UC Treatments / Results  Labs (all labs ordered are listed, but only abnormal results are displayed) Labs Reviewed - No  data to display  EKG   Radiology No results found.  Procedures Procedures (including critical care time)  Medications Ordered in UC Medications - No data to display  Initial Impression / Assessment and Plan / UC Course  I have reviewed the triage vital signs and the nursing notes.  Pertinent labs & imaging results that were available during my care of the patient were reviewed by me and considered in my medical decision making (see chart for details).     MDM: 1.  Pulsatile tinnitus of left ear-Advised patient to follow-up with ENT or audiology for ringing of left ear.  Patient discharged home, hemodynamically stable. Final Clinical Impressions(s) / UC Diagnoses   Final diagnoses:  Pulsatile tinnitus of left ear     Discharge Instructions      Advised patient to follow-up with ENT or audiology for ringing of left ear.     ED Prescriptions   None    PDMP not reviewed this encounter.   Teddy Sharper, FNP 07/18/24 1650

## 2024-07-19 ENCOUNTER — Ambulatory Visit
Admission: EM | Admit: 2024-07-19 | Discharge: 2024-07-19 | Disposition: A | Discharge status: Home / Self Care | Attending: Physician Assistant | Admitting: Physician Assistant

## 2024-07-19 ENCOUNTER — Other Ambulatory Visit: Payer: Self-pay

## 2024-07-19 DIAGNOSIS — R5383 Other fatigue: Secondary | ICD-10-CM

## 2024-07-19 DIAGNOSIS — R509 Fever, unspecified: Secondary | ICD-10-CM

## 2024-07-19 DIAGNOSIS — R42 Dizziness and giddiness: Secondary | ICD-10-CM | POA: Diagnosis not present

## 2024-07-19 LAB — POC COVID19/FLU A&B COMBO
Covid Antigen, POC: NEGATIVE
Influenza A Antigen, POC: NEGATIVE
Influenza B Antigen, POC: NEGATIVE

## 2024-07-19 MED ORDER — MECLIZINE HCL 25 MG PO TABS
25.0000 mg | ORAL_TABLET | Freq: Three times a day (TID) | ORAL | 0 refills | Status: AC | PRN
  Filled 2024-07-19: qty 30, 10d supply, fill #0

## 2024-07-19 NOTE — ED Provider Notes (Signed)
 [facility]    CSN: [id number] Arrival date & time: 07/19/24  1714      History   Chief Complaint Chief Complaint  Patient presents with   Follow-up    HPI Gerarda Conklin is a 58 y.o. female.  has a past medical history of Asthma, Bell's palsy, Depression, Diabetes mellitus without complication (HCC), High cholesterol, and Stroke (HCC).   HPI  Discussed the use of AI scribe software for clinical note transcription with the patient, who gave verbal consent to proceed.  The patient presents with fatigue, lightheadedness, and intermittent fevers.  She has been experiencing significant fatigue, lightheadedness, and intermittent fevers since Thursday morning. Despite visiting urgent care the previous day, symptoms have persisted for six days.  She occasionally hears her heartbeat in the left ear and experiences shortness of breath during physical activity, such as walking her dog. A fever of over 101F occurred around 2 PM today. Tylenol  has been effective in reducing her fevers.  No nasal congestion, runny nose, sore throat, cough, vomiting, diarrhea, rashes, or palpitations. She mentions a sensation of postnasal drip but no associated cough. Occasional nausea is present, but there is no constipation. The patient reports shortness of breath only when doing a lot of activity or walking her dog.  Her past medical history includes childhood asthma, which no longer affects her, and diabetes, with blood sugar levels reportedly stable. 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 Recent COVID and flu tests were negative.   Past Medical History:  Diagnosis Date   Asthma    Bell's palsy    Depression    Diabetes mellitus without complication (HCC)    type II   High cholesterol    Stroke [facility])     Patient Active Problem List   Diagnosis Date Noted   Acute ischemic stroke (HCC) 01/20/2023   Acute medial meniscus tear  of right knee 05/29/2022   Acute pain of right knee 05/15/2022    Past Surgical History:  Procedure Laterality Date   APPENDECTOMY     BREAST BIOPSY Right 03/2018   BREAST BIOPSY Right 05/29/2023   MM RT BREAST BX W LOC DEV 1ST LESION IMAGE BX SPEC STEREO GUIDE 05/29/2023 [facility]   OOPHORECTOMY     right    OB History   No obstetric history on file.      Home Medications    Prior to Admission medications   Medication Sig Start Date End Date Taking? Authorizing Provider  meclizine (ANTIVERT) 25 MG tablet Take 1 tablet (25 mg total) by mouth 3 (three) times daily as needed for dizziness. 07/19/24  Yes Roselie Cirigliano E, PA-C  aspirin EC 81 MG tablet Take 81 mg by mouth daily. Swallow whole.    [provider]  cholecalciferol  (VITAMIN D3) 25 MCG (1000 UNIT) tablet Take 2,000 Units by mouth daily.    [provider]  empagliflozin  (JARDIANCE ) 25 MG TABS tablet Take 1 tablet (25 mg total) by mouth daily. APPOINTMENT REQUIRED FOR FURTHER REFILLS 07/11/24     escitalopram  (LEXAPRO ) 20 MG tablet Take 20 mg by mouth at bedtime. 07/21/17   [provider]  escitalopram  (LEXAPRO ) 20 MG tablet Take 1 tablet (20 mg total) by mouth daily. APPOINTMENT REQUIRED FOR FURTHER REFILLS Patient not taking: Reported on 07/18/2024 07/11/24     JARDIANCE  25 MG TABS tablet Take 25 mg by mouth daily.    [provider]  metFORMIN  (  GLUCOPHAGE -XR) 500 MG 24 hr tablet Take 1,000 mg by mouth 2 (two) times daily with a meal. 01/30/22 04/19/23  [provider]  metFORMIN  (GLUCOPHAGE -XR) 500 MG 24 hr tablet Take 2 tablets (1,000 mg total) by mouth in the morning AND 2 tablets (1,000 mg total) every evening. Take with meals. APPOINTMENT REQUIRED FOR FURTHER REFILLS. 07/11/24     ondansetron  (ZOFRAN ) 4 MG tablet Take 4 mg by mouth every 8 (eight) hours as needed for nausea. 12/12/22   [provider]  OZEMPIC , 1 MG/DOSE, 4 MG/3ML SOPN Inject 1 mg into the skin once  a week. Tuesday    [provider]  predniSONE  (DELTASONE ) 20 MG tablet Take 3 tablets (60 mg total) by mouth daily with breakfast. 01/22/23   Judithe Rocky BROCKS, NP  rosuvastatin  (CRESTOR ) 40 MG tablet Take 40 mg by mouth daily. 01/06/22 04/19/23  [provider]  rosuvastatin  (CRESTOR ) 40 MG tablet Take 1 tablet (40 mg total) by mouth daily. 07/11/24     Semaglutide , 1 MG/DOSE, (OZEMPIC , 1 MG/DOSE,) 4 MG/3ML SOPN Inject 1 mg into the skin once a week. 06/15/24     valACYclovir  (VALTREX ) 1000 MG tablet Take 1 tablet (1,000 mg total) by mouth 3 (three) times daily. 01/21/23   Judithe Rocky BROCKS, NP    Family History Family History  Problem Relation Age of Onset   Heart failure Mother    Cancer Father    Breast cancer Paternal Grandmother     Social History Social History   Tobacco Use   Smoking status: Never   Smokeless tobacco: Never  Vaping Use   Vaping status: Never Used  Substance Use Topics   Alcohol  use: No   Drug use: No     Allergies   Peanut-containing drug products and Other   Review of Systems Review of Systems  Constitutional:  Positive for fatigue and fever.  HENT:  Positive for postnasal drip and tinnitus. Negative for congestion, rhinorrhea and sore throat.   Respiratory:  Positive for shortness of breath. Negative for cough and wheezing.   Gastrointestinal:  Positive for nausea. Negative for diarrhea and vomiting.  Skin:  Negative for rash.  Neurological:  Positive for light-headedness and headaches.     Physical Exam Triage Vital Signs ED Triage Vitals  Encounter Vitals Group     BP 07/19/24 1831 124/76     Girls Systolic BP Percentile --      Girls Diastolic BP Percentile --      Boys Systolic BP Percentile --      Boys Diastolic BP Percentile --      Pulse Rate 07/19/24 1831 89     Resp 07/19/24 1831 19     Temp 07/19/24 1831 98.4 F (36.9 C)     Temp Source 07/19/24 1831 Oral     SpO2 07/19/24 1831 96 %     Weight 07/19/24 1831 185 lb  (83.9 kg)     Height 07/19/24 1831 5' 7.5 (1.715 m)     Head Circumference --      Peak Flow --      Pain Score 07/19/24 1844 0     Pain Loc --      Pain Education --      Exclude from Growth Chart --    No data found.  Updated Vital Signs BP 124/76 (BP Location: Right Arm)   Pulse 89   Temp 98.4 F (36.9 C) (Oral)   Resp 19   Ht 5' 7.5 (  1.715 m)   Wt 185 lb (83.9 kg)   LMP  (LMP Unknown)   SpO2 96%   BMI 28.55 kg/m   Visual Acuity Right Eye Distance:   Left Eye Distance:   Bilateral Distance:    Right Eye Near:   Left Eye Near:    Bilateral Near:     Physical Exam Vitals reviewed.  Constitutional:      General: She is awake. She is not in acute distress.    Appearance: Normal appearance. She is well-developed and well-groomed. She is not ill-appearing, toxic-appearing or diaphoretic.  HENT:     Head: Normocephalic and atraumatic.     Right Ear: Hearing, tympanic membrane and ear canal normal.     Left Ear: Hearing, tympanic membrane and ear canal normal.     Mouth/Throat:     Lips: Pink.     Mouth: Mucous membranes are moist.     Pharynx: Oropharynx is clear. Uvula midline. No pharyngeal swelling, oropharyngeal exudate, posterior oropharyngeal erythema, uvula swelling or postnasal drip.  Eyes:     General: Lids are normal. Gaze aligned appropriately.     Extraocular Movements: Extraocular movements intact.     Conjunctiva/sclera: Conjunctivae normal.     Pupils: Pupils are equal, round, and reactive to light.  Cardiovascular:     Rate and Rhythm: Normal rate and regular rhythm.     Pulses: Normal pulses.          Radial pulses are 2+ on the right side and 2+ on the left side.     Heart sounds: Normal heart sounds. No murmur heard.    No friction rub. No gallop.  Pulmonary:     Effort: Pulmonary effort is normal.     Breath sounds: Normal breath sounds. No decreased air movement. No decreased breath sounds, wheezing, rhonchi or rales.  Musculoskeletal:      Cervical back: Normal range of motion and neck supple.  Lymphadenopathy:     Head:     Right side of head: No submental, submandibular or preauricular adenopathy.     Left side of head: No submental, submandibular or preauricular adenopathy.     Cervical:     Right cervical: No superficial cervical adenopathy.    Left cervical: No superficial cervical adenopathy.     Upper Body:     Right upper body: No supraclavicular adenopathy.     Left upper body: No supraclavicular adenopathy.  Skin:    General: Skin is warm and dry.  Neurological:     General: No focal deficit present.     Mental Status: She is alert and oriented to person, place, and time.  Psychiatric:        Mood and Affect: Mood normal.        Behavior: Behavior normal. Behavior is cooperative.        Thought Content: Thought content normal.        Judgment: Judgment normal.      UC Treatments / Results  Labs (all labs ordered are listed, but only abnormal results are displayed) Labs Reviewed  COMPREHENSIVE METABOLIC PANEL WITH GFR  CBC WITH DIFFERENTIAL/PLATELET  POC COVID19/FLU A&B COMBO    EKG   Radiology No results found.  Procedures Procedures (including critical care time)  Medications Ordered in UC Medications - No data to display  Initial Impression / Assessment and Plan / UC Course  I have reviewed the triage vital signs and the nursing notes.  Pertinent labs & imaging results that  were available during my care of the patient were reviewed by me and considered in my medical decision making (see chart for details).      Final Clinical Impressions(s) / UC Diagnoses   Final diagnoses:  Dizziness  Fever, unspecified  Fatigue, unspecified type   Acute viral illness with dizziness and fatigue Symptoms include fatigue, lightheadedness, intermittent fevers, and dizziness since Thursday. No nasal congestion, sore throat, or cough. Shortness of breath on exertion. Occasional nausea without  vomiting or diarrhea. Negative COVID and flu tests. Possible viral illness causing Eustachian tube dysfunction leading to dizziness. Vertigo unlikely due to lack of nystagmus and symptom profile. - Ordered basic labs to check for anemia or electrolyte imbalance. - Prescribed meclizine for dizziness, cautioned about drowsiness. - Advised use of Mucinex and Flonase to alleviate dizziness. - Instructed to follow up with PCP if symptoms do not improve in three days.  Type 2 diabetes mellitus Blood sugar levels are well-controlled. - Continue monitoring blood sugar levels.    Discharge Instructions      VISIT SUMMARY:  You came in today because you have been feeling very tired, lightheaded, and have had off-and-on fevers for the past six days. You also mentioned hearing your heartbeat in your left ear and feeling short of breath when doing activities like walking your dog. You have not had any nasal congestion, sore throat, or cough.  Your COVID and flu testing were negative.  YOUR PLAN:  -ACUTE VIRAL ILLNESS WITH DIZZINESS AND FATIGUE: You have symptoms that suggest a viral illness, which is causing you to feel tired, lightheaded, and have fevers. This may also be affecting your Eustachian tube, leading to dizziness. We have ordered some basic lab tests to check for anemia or any imbalance in your electrolytes. You have been prescribed meclizine to help with the dizziness, but be aware that it can make you drowsy. Additionally, using Mucinex and Flonase may help relieve some of your dizziness. If your symptoms do not improve in three days, please follow up with your primary care provider.  -TYPE 2 DIABETES MELLITUS: Your blood sugar levels are well-controlled. Please continue to monitor your blood sugar levels regularly to keep them stable.  INSTRUCTIONS:  We have ordered basic lab tests to check for anemia or electrolyte imbalance. Please follow up with your primary care provider if your  symptoms do not improve in three days.     ED Prescriptions     Medication Sig Dispense Auth. Provider   meclizine (ANTIVERT) 25 MG tablet Take 1 tablet (25 mg total) by mouth 3 (three) times daily as needed for dizziness. 30 tablet Aldene Hendon E, PA-C      PDMP not reviewed this encounter.   Marylene Rocky BRAVO, PA-C 07/19/24 1931

## 2024-07-19 NOTE — Discharge Instructions (Addendum)
 VISIT SUMMARY:  You came in today because you have been feeling very tired, lightheaded, and have had off-and-on fevers for the past six days. You also mentioned hearing your heartbeat in your left ear and feeling short of breath when doing activities like walking your dog. You have not had any nasal congestion, sore throat, or cough.  Your COVID and flu testing were negative.  YOUR PLAN:  -ACUTE VIRAL ILLNESS WITH DIZZINESS AND FATIGUE: You have symptoms that suggest a viral illness, which is causing you to feel tired, lightheaded, and have fevers. This may also be affecting your Eustachian tube, leading to dizziness. We have ordered some basic lab tests to check for anemia or any imbalance in your electrolytes. You have been prescribed meclizine to help with the dizziness, but be aware that it can make you drowsy. Additionally, using Mucinex and Flonase may help relieve some of your dizziness. If your symptoms do not improve in three days, please follow up with your primary care provider.  -TYPE 2 DIABETES MELLITUS: Your blood sugar levels are well-controlled. Please continue to monitor your blood sugar levels regularly to keep them stable.  INSTRUCTIONS:  We have ordered basic lab tests to check for anemia or electrolyte imbalance. Please follow up with your primary care provider if your symptoms do not improve in three days.

## 2024-07-19 NOTE — ED Triage Notes (Signed)
 Pt presents to urgent care for follow-up visit. Was seen yesterday at the [facility] location. Pt is presenting with complaints of fatigue, dizziness, and fevers x 5 days. Walked her dog at 73 AM and felt like she was going to pass out. OTC Tylenol  taken today at 2 PM. Had a fever of 101.1 F around this time. No pain at this time. 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

## 2024-07-20 ENCOUNTER — Ambulatory Visit (HOSPITAL_COMMUNITY): Payer: Self-pay

## 2024-07-20 ENCOUNTER — Other Ambulatory Visit (HOSPITAL_BASED_OUTPATIENT_CLINIC_OR_DEPARTMENT_OTHER): Payer: Self-pay

## 2024-07-20 LAB — COMPREHENSIVE METABOLIC PANEL WITH GFR
ALT: 9 IU/L (ref 0–32)
AST: 12 IU/L (ref 0–40)
Albumin: 4.5 g/dL (ref 3.8–4.9)
Alkaline Phosphatase: 80 IU/L (ref 49–135)
BUN/Creatinine Ratio: 22 (ref 9–23)
BUN: 15 mg/dL (ref 6–24)
Bilirubin Total: 0.3 mg/dL (ref 0.0–1.2)
CO2: 23 mmol/L (ref 20–29)
Calcium: 9.6 mg/dL (ref 8.7–10.2)
Chloride: 105 mmol/L (ref 96–106)
Creatinine, Ser: 0.68 mg/dL (ref 0.57–1.00)
Globulin, Total: 2.3 g/dL (ref 1.5–4.5)
Glucose: 116 mg/dL — ABNORMAL HIGH (ref 70–99)
Potassium: 4 mmol/L (ref 3.5–5.2)
Sodium: 139 mmol/L (ref 134–144)
Total Protein: 6.8 g/dL (ref 6.0–8.5)
eGFR: 101 mL/min/1.73 (ref >59)

## 2024-07-20 LAB — CBC WITH DIFFERENTIAL/PLATELET
Basophils Absolute: 0.1 x10E3/uL (ref 0.0–0.2)
Basos: 1 %
EOS (ABSOLUTE): 0.3 x10E3/uL (ref 0.0–0.4)
Eos: 4 %
Hematocrit: 40.2 % (ref 34.0–46.6)
Hemoglobin: 12.9 g/dL (ref 11.1–15.9)
Immature Grans (Abs): 0 x10E3/uL (ref 0.0–0.1)
Immature Granulocytes: 0 %
Lymphocytes Absolute: 3.5 x10E3/uL — ABNORMAL HIGH (ref 0.7–3.1)
Lymphs: 47 %
MCH: 26.2 pg — ABNORMAL LOW (ref 26.6–33.0)
MCHC: 32.1 g/dL (ref 31.5–35.7)
MCV: 82 fL (ref 79–97)
Monocytes Absolute: 0.5 x10E3/uL (ref 0.1–0.9)
Monocytes: 7 %
Neutrophils Absolute: 3 x10E3/uL (ref 1.4–7.0)
Neutrophils: 41 %
Platelets: 327 x10E3/uL (ref 150–450)
RBC: 4.93 x10E6/uL (ref 3.77–5.28)
RDW: 14.5 % (ref 11.7–15.4)
WBC: 7.4 x10E3/uL (ref 3.4–10.8)

## 2024-07-21 ENCOUNTER — Ambulatory Visit
Admission: RE | Admit: 2024-07-21 | Discharge: 2024-07-21 | Disposition: A | Discharge status: Home / Self Care | Source: Ambulatory Visit

## 2024-07-21 DIAGNOSIS — Z1231 Encounter for screening mammogram for malignant neoplasm of breast: Secondary | ICD-10-CM

## 2024-08-01 ENCOUNTER — Other Ambulatory Visit (HOSPITAL_BASED_OUTPATIENT_CLINIC_OR_DEPARTMENT_OTHER): Payer: Self-pay

## 2024-08-08 ENCOUNTER — Other Ambulatory Visit (HOSPITAL_BASED_OUTPATIENT_CLINIC_OR_DEPARTMENT_OTHER): Payer: Self-pay

## 2024-08-09 ENCOUNTER — Other Ambulatory Visit (HOSPITAL_BASED_OUTPATIENT_CLINIC_OR_DEPARTMENT_OTHER): Payer: Self-pay

## 2024-08-09 MED ORDER — SEMAGLUTIDE (1 MG/DOSE) 4 MG/3ML ~~LOC~~ SOPN
1.0000 mg | PEN_INJECTOR | SUBCUTANEOUS | 0 refills | Status: DC
  Filled 2024-08-09: qty 3, 28d supply, fill #0

## 2024-08-12 ENCOUNTER — Encounter: Payer: Self-pay | Admitting: Podiatry

## 2024-08-12 ENCOUNTER — Ambulatory Visit: Admitting: Podiatry

## 2024-08-12 VITALS — Ht 67.5 in | Wt 185.0 lb

## 2024-08-12 DIAGNOSIS — L600 Ingrowing nail: Secondary | ICD-10-CM

## 2024-08-12 MED ORDER — DOXYCYCLINE HYCLATE 100 MG PO TABS: 100.0000 mg | ORAL_TABLET | Freq: Two times a day (BID) | ORAL | 0 refills | Status: AC

## 2024-08-12 NOTE — Patient Instructions (Signed)

## 2024-08-12 NOTE — Progress Notes (Signed)
 Subjective:  Patient ID: Teresa Maxwell, female    DOB: 1966/09/18,  MRN: [id number]  Chief Complaint  Patient presents with   Ingrown Toenail    Patient is here for ingrown toenail bilateral borders and cuticle    58 y.o. female presents with the above complaint.  Patient presents with left hallux medial border ingrown painful to touch has progressed gotten worse worse with ambulation and shoe pressure she would like to discuss treatment options for this pain scale 7 out of 10 dull aching nature.  She would like to have it removed.  She is a diabetic with last A1c of 7.1.   Review of Systems: Negative except as noted in the HPI. Denies N/V/F/Ch.  Past Medical History:  Diagnosis Date   Asthma    Bell's palsy    Depression    Diabetes mellitus without complication (HCC)    type II   High cholesterol    Stroke [facility])     Current Outpatient Medications:    doxycycline (VIBRA-TABS) 100 MG tablet, Take 1 tablet (100 mg total) by mouth 2 (two) times daily., Disp: 20 tablet, Rfl: 0   aspirin EC 81 MG tablet, Take 81 mg by mouth daily. Swallow whole., Disp: , Rfl:    cholecalciferol  (VITAMIN D3) 25 MCG (1000 UNIT) tablet, Take 2,000 Units by mouth daily., Disp: , Rfl:    empagliflozin  (JARDIANCE ) 25 MG TABS tablet, Take 1 tablet (25 mg total) by mouth daily. APPOINTMENT REQUIRED FOR FURTHER REFILLS, Disp: 30 tablet, Rfl: 0   escitalopram  (LEXAPRO ) 20 MG tablet, Take 20 mg by mouth at bedtime., Disp: , Rfl:    escitalopram  (LEXAPRO ) 20 MG tablet, Take 1 tablet (20 mg total) by mouth daily. APPOINTMENT REQUIRED FOR FURTHER REFILLS (Patient not taking: Reported on 07/18/2024), Disp: 30 tablet, Rfl: 0   JARDIANCE  25 MG TABS tablet, Take 25 mg by mouth daily., Disp: , Rfl:    meclizine (ANTIVERT) 25 MG tablet, Take 1 tablet (25 mg total) by mouth 3 (three) times daily as needed for dizziness., Disp: 30 tablet, Rfl: 0   metFORMIN  (GLUCOPHAGE -XR) 500 MG 24 hr tablet, Take 1,000 mg by mouth 2 (two)  times daily with a meal., Disp: , Rfl:    metFORMIN  (GLUCOPHAGE -XR) 500 MG 24 hr tablet, Take 2 tablets (1,000 mg total) by mouth in the morning AND 2 tablets (1,000 mg total) every evening. Take with meals. APPOINTMENT REQUIRED FOR FURTHER REFILLS., Disp: 120 tablet, Rfl: 0   ondansetron  (ZOFRAN ) 4 MG tablet, Take 4 mg by mouth every 8 (eight) hours as needed for nausea., Disp: , Rfl:    OZEMPIC , 1 MG/DOSE, 4 MG/3ML SOPN, Inject 1 mg into the skin once a week. Tuesday, Disp: , Rfl:    predniSONE  (DELTASONE ) 20 MG tablet, Take 3 tablets (60 mg total) by mouth daily with breakfast., Disp: 21 tablet, Rfl: 0   rosuvastatin  (CRESTOR ) 40 MG tablet, Take 40 mg by mouth daily., Disp: , Rfl:    rosuvastatin  (CRESTOR ) 40 MG tablet, Take 1 tablet (40 mg total) by mouth daily., Disp: 30 tablet, Rfl: 0   Semaglutide , 1 MG/DOSE, (OZEMPIC , 1 MG/DOSE,) 4 MG/3ML SOPN, Inject 1 mg into the skin once a week., Disp: 3 mL, Rfl: 0   valACYclovir  (VALTREX ) 1000 MG tablet, Take 1 tablet (1,000 mg total) by mouth 3 (three) times daily., Disp: 21 tablet, Rfl: 0  Social History   Tobacco Use  Smoking Status Never  Smokeless Tobacco Never    Allergies  Allergen  Reactions   Peanut-Containing Drug Products Anaphylaxis   Other Itching    Nuts   Objective:  There were no vitals filed for this visit. Body mass index is 28.55 kg/m. Constitutional Well developed. Well nourished.  Vascular Dorsalis pedis pulses palpable bilaterally. Posterior tibial pulses palpable bilaterally. Capillary refill normal to all digits.  No cyanosis or clubbing noted. Pedal hair growth normal.  Neurologic Normal speech. Oriented to person, place, and time. Epicritic sensation to light touch grossly present bilaterally.  Dermatologic Painful ingrowing nail at medial nail borders of the hallux nail left. No other open wounds. No skin lesions.  Orthopedic: Normal joint ROM without pain or crepitus bilaterally. No visible  deformities. No bony tenderness.   Radiographs: None Assessment:   1. Ingrown left big toenail    Plan:  Patient was evaluated and treated and all questions answered.  Ingrown Nail, left -Patient elects to proceed with minor surgery to remove ingrown toenail removal today. Consent reviewed and signed by patient. -Ingrown nail excised. See procedure note. -Educated on post-procedure care including soaking. Written instructions provided and reviewed. -Patient to follow up in 2 weeks for nail check.  Procedure: Excision of Ingrown Toenail Location: Left 1st toe medial nail borders. Anesthesia: Lidocaine  1% plain; 1.5 mL and Marcaine  0.5% plain; 1.5 mL, digital block. Skin Prep: Betadine. Dressing: Silvadene; telfa; dry, sterile, compression dressing. Technique: Following skin prep, the toe was exsanguinated and a tourniquet was secured at the base of the toe. The affected nail border was freed, split with a nail splitter, and excised. Chemical matrixectomy was then performed with phenol and irrigated out with alcohol . The tourniquet was then removed and sterile dressing applied. Disposition: Patient tolerated procedure well. Patient to return in 2 weeks for follow-up.   No follow-ups on file.

## 2024-09-01 ENCOUNTER — Other Ambulatory Visit (HOSPITAL_BASED_OUTPATIENT_CLINIC_OR_DEPARTMENT_OTHER): Payer: Self-pay

## 2024-09-07 ENCOUNTER — Other Ambulatory Visit (HOSPITAL_BASED_OUTPATIENT_CLINIC_OR_DEPARTMENT_OTHER): Payer: Self-pay

## 2024-09-11 ENCOUNTER — Other Ambulatory Visit (HOSPITAL_BASED_OUTPATIENT_CLINIC_OR_DEPARTMENT_OTHER): Payer: Self-pay

## 2024-09-12 ENCOUNTER — Other Ambulatory Visit (HOSPITAL_BASED_OUTPATIENT_CLINIC_OR_DEPARTMENT_OTHER): Payer: Self-pay

## 2024-09-12 MED ORDER — METFORMIN HCL ER 500 MG PO TB24
ORAL_TABLET | ORAL | 0 refills | Status: DC
  Filled 2024-09-12: qty 120, 30d supply, fill #0

## 2024-09-12 MED ORDER — ROSUVASTATIN CALCIUM 40 MG PO TABS
40.0000 mg | ORAL_TABLET | Freq: Every day | ORAL | 0 refills | Status: DC
  Filled 2024-09-12: qty 30, 30d supply, fill #0

## 2024-09-12 MED ORDER — ESCITALOPRAM OXALATE 20 MG PO TABS
20.0000 mg | ORAL_TABLET | Freq: Every day | ORAL | 0 refills | Status: DC
  Filled 2024-09-12: qty 30, 30d supply, fill #0

## 2024-09-13 ENCOUNTER — Other Ambulatory Visit (HOSPITAL_BASED_OUTPATIENT_CLINIC_OR_DEPARTMENT_OTHER): Payer: Self-pay

## 2024-09-19 ENCOUNTER — Other Ambulatory Visit: Payer: Self-pay

## 2024-09-19 ENCOUNTER — Ambulatory Visit: Admitting: Family Medicine

## 2024-09-19 ENCOUNTER — Encounter: Payer: Self-pay | Admitting: Family Medicine

## 2024-09-19 ENCOUNTER — Other Ambulatory Visit (HOSPITAL_BASED_OUTPATIENT_CLINIC_OR_DEPARTMENT_OTHER): Payer: Self-pay

## 2024-09-19 VITALS — BP 141/83 | HR 94 | Temp 98.3°F | Ht 67.5 in | Wt 195.8 lb

## 2024-09-19 DIAGNOSIS — F419 Anxiety disorder, unspecified: Secondary | ICD-10-CM | POA: Diagnosis not present

## 2024-09-19 DIAGNOSIS — J4520 Mild intermittent asthma, uncomplicated: Secondary | ICD-10-CM | POA: Diagnosis not present

## 2024-09-19 DIAGNOSIS — E119 Type 2 diabetes mellitus without complications: Secondary | ICD-10-CM | POA: Insufficient documentation

## 2024-09-19 DIAGNOSIS — B009 Herpesviral infection, unspecified: Secondary | ICD-10-CM | POA: Insufficient documentation

## 2024-09-19 DIAGNOSIS — E782 Mixed hyperlipidemia: Secondary | ICD-10-CM | POA: Diagnosis not present

## 2024-09-19 DIAGNOSIS — F32A Depression, unspecified: Secondary | ICD-10-CM

## 2024-09-19 DIAGNOSIS — Z7689 Persons encountering health services in other specified circumstances: Secondary | ICD-10-CM | POA: Diagnosis not present

## 2024-09-19 MED ORDER — ALBUTEROL SULFATE HFA 108 (90 BASE) MCG/ACT IN AERS
2.0000 | INHALATION_SPRAY | Freq: Four times a day (QID) | RESPIRATORY_TRACT | 1 refills | Status: AC | PRN
  Filled 2024-09-19 – 2024-10-03 (×2): qty 6.7, 25d supply, fill #0

## 2024-09-19 MED ORDER — JARDIANCE 25 MG PO TABS
25.0000 mg | ORAL_TABLET | Freq: Every day | ORAL | 1 refills | Status: AC
  Filled 2024-09-19: qty 90, 90d supply, fill #0
  Filled 2024-09-19: qty 30, 30d supply, fill #0
  Filled 2024-09-19 – 2024-10-03 (×2): qty 90, 90d supply, fill #0

## 2024-09-19 MED ORDER — OZEMPIC (1 MG/DOSE) 4 MG/3ML ~~LOC~~ SOPN
1.0000 mg | PEN_INJECTOR | SUBCUTANEOUS | 1 refills | Status: AC
  Filled 2024-09-19 – 2024-10-03 (×2): qty 9, 84d supply, fill #0

## 2024-09-19 NOTE — Progress Notes (Signed)
 New Patient Office Visit  Subjective    Patient ID: Teresa Maxwell, female    DOB: [birth year]  Age: 58 y.o. MRN: [id number]  CC:  Chief Complaint  Patient presents with   Medical Management of Chronic Issues    HPI Teresa Maxwell presents to establish care with this practice. She is new to me. Works at [employer]. Chronic conditions include:   HLD: rosuvastatin  40 mg daily Tolerates well. 04/30/24: LDL 113 (goal 70) Lipid panel today.   DM: metformin  XR 1000  mg BID, Jardiance  25 mg daily, Semaglutide  1 mg weekly Gets nausea on semaglutide , wants to stay on this dose. Has been on this dose for one.  05/01/23: A1C: 7.1 CMP, A1C, urine microalbumin today.  Refills as needed: Jardiance  and Ozempic  today.  07/19/24: CMP: GFR: 101, K+ 4.0  Anxiety/depression: Lexapro  20 mg daily Symptoms are well controlled. GAD-7: 1 PHQ-9: 0 No thoughts of self harm.   History of asthma: since childhood. Has not been on medications for some time.  Reports some cough and chest tightness recently, worse with cold weather.  No wheezing. Needs albuterol  inhaler. Understands if she needs rescue inhaler often, we will start maintenance inhaler.  Continue to monitor.     Outpatient Encounter Medications as of 09/19/2024  Medication Sig   albuterol  (VENTOLIN  HFA) 108 (90 Base) MCG/ACT inhaler Inhale 2 puffs into the lungs every 6 (six) hours as needed for wheezing or shortness of breath.   aspirin EC 81 MG tablet Take 81 mg by mouth daily. Swallow whole.   cholecalciferol  (VITAMIN D3) 25 MCG (1000 UNIT) tablet Take 2,000 Units by mouth daily.   doxycycline  (VIBRA -TABS) 100 MG tablet Take 1 tablet (100 mg total) by mouth 2 (two) times daily.   escitalopram  (LEXAPRO ) 20 MG tablet Take 20 mg by mouth at bedtime.   meclizine  (ANTIVERT ) 25 MG tablet Take 1 tablet (25 mg total) by mouth 3 (three) times daily as needed for dizziness.   metFORMIN  (GLUCOPHAGE -XR) 500 MG 24 hr tablet Take 1,000 mg by mouth 2  (two) times daily with a meal.   ondansetron  (ZOFRAN ) 4 MG tablet Take 4 mg by mouth every 8 (eight) hours as needed for nausea.   rosuvastatin  (CRESTOR ) 40 MG tablet Take 40 mg by mouth daily.   [DISCONTINUED] empagliflozin  (JARDIANCE ) 25 MG TABS tablet Take 1 tablet (25 mg total) by mouth daily. APPOINTMENT REQUIRED FOR FURTHER REFILLS   [DISCONTINUED] JARDIANCE  25 MG TABS tablet Take 25 mg by mouth daily.   [DISCONTINUED] OZEMPIC , 1 MG/DOSE, 4 MG/3ML SOPN Inject 1 mg into the skin once a week. Tuesday   escitalopram  (LEXAPRO ) 20 MG tablet Take 1 tablet (20 mg total) by mouth daily. APPOINTMENT REQUIRED FOR FURTHER REFILLS (Patient not taking: Reported on 09/19/2024)   JARDIANCE  25 MG TABS tablet Take 1 tablet (25 mg total) by mouth daily.   metFORMIN  (GLUCOPHAGE -XR) 500 MG 24 hr tablet Take 2 tablets (1,000 mg total) by mouth in the morning AND 2 tablets (1,000 mg total) every evening. Take with meals. APPOINTMENT REQUIRED FOR FURTHER REFILLS. (Patient not taking: No sig reported)   OZEMPIC , 1 MG/DOSE, 4 MG/3ML SOPN Inject 1 mg into the skin once a week. Tuesday   predniSONE  (DELTASONE ) 20 MG tablet Take 3 tablets (60 mg total) by mouth daily with breakfast. (Patient not taking: Reported on 09/19/2024)   rosuvastatin  (CRESTOR ) 40 MG tablet Take 1 tablet (40 mg total) by mouth daily. (Patient not taking: Reported on 09/19/2024)   valACYclovir  (  VALTREX ) 1000 MG tablet Take 1 tablet (1,000 mg total) by mouth 3 (three) times daily. (Patient not taking: Reported on 09/19/2024)   [DISCONTINUED] Semaglutide , 1 MG/DOSE, (OZEMPIC , 1 MG/DOSE,) 4 MG/3ML SOPN Inject 1 mg into the skin once a week. (Patient not taking: Reported on 09/19/2024)   No facility-administered encounter medications on file as of 09/19/2024.    Past Medical History:  Diagnosis Date   Asthma    Bell's palsy    Depression    Diabetes mellitus without complication (HCC)    type II   High cholesterol    Stroke [facility])     Past  Surgical History:  Procedure Laterality Date   APPENDECTOMY     BREAST BIOPSY Right 03/2018   BREAST BIOPSY Right 05/29/2023   MM RT BREAST BX W LOC DEV 1ST LESION IMAGE BX SPEC STEREO GUIDE 05/29/2023 [facility]   OOPHORECTOMY     right    Family History  Problem Relation Age of Onset   Heart failure Mother    Cancer Father    Breast cancer Sister    Breast cancer Paternal Grandmother     Social History   Socioeconomic History   Marital status: Divorced    Spouse name: Not on file   Number of children: Not on file   Years of education: Not on file   Highest education level: Bachelor's degree (e.g., BA, AB, BS)  Occupational History   Not on file  Tobacco Use   Smoking status: Never   Smokeless tobacco: Never  Vaping Use   Vaping status: Never Used  Substance and Sexual Activity   Alcohol  use: No   Drug use: No   Sexual activity: Not on file  Other Topics Concern   Not on file  Social History Narrative   Not on file   Social Drivers of Health   Tobacco Use: Low Risk (09/19/2024)   Patient History    Smoking Tobacco Use: Never    Smokeless Tobacco Use: Never    Passive Exposure: Not on file  Financial Resource Strain: Low Risk (09/18/2024)   Overall Financial Resource Strain (CARDIA)    Difficulty of Paying Living Expenses: Not very hard  Food Insecurity: No Food Insecurity (09/18/2024)   Epic    Worried About Radiation Protection Practitioner of Food in the Last Year: Never true    Ran Out of Food in the Last Year: Never true  Transportation Needs: No Transportation Needs (09/18/2024)   Epic    Lack of Transportation (Medical): No    Lack of Transportation (Non-Medical): No  Physical Activity: Sufficiently Active (09/18/2024)   Exercise Vital Sign    Days of Exercise per Week: 3 days    Minutes of Exercise per Session: 50 min  Stress: No Stress Concern Present (09/18/2024)   [employer] of Occupational Health - Occupational Stress Questionnaire    Feeling of  Stress: Only a little  Social Connections: Socially Isolated (09/18/2024)   Social Connection and Isolation Panel    Frequency of Communication with Friends and Family: More than three times a week    Frequency of Social Gatherings with Friends and Family: More than three times a week    Attends Religious Services: Never    Database Administrator or Organizations: No    Attends Banker Meetings: Not on file    Marital Status: Divorced  Intimate Partner Violence: Not on file  Depression (PHQ2-9): Low Risk (09/19/2024)   Depression (PHQ2-9)  PHQ-2 Score: 0  Alcohol  Screen: Not on file  Housing: Low Risk (09/18/2024)   Epic    Unable to Pay for Housing in the Last Year: No    Number of Times Moved in the Last Year: 0    Homeless in the Last Year: No  Utilities: Not At Risk (05/27/2022)   Received from [facility]   [facility] Utilities    Threatened with loss of utilities: No  Health Literacy: Not on file    ROS      Objective    BP (!) 141/83 (BP Location: Left Arm, Patient Position: Sitting, Cuff Size: Normal)   Pulse 94   Temp 98.3 F (36.8 C) (Oral)   Ht 5' 7.5 (1.715 m)   Wt 195 lb 12.8 oz (88.8 kg)   LMP  (LMP Unknown)   SpO2 97%   BMI 30.21 kg/m   Physical Exam Vitals and nursing note reviewed.  Constitutional:      Appearance: Normal appearance.  Cardiovascular:     Rate and Rhythm: Normal rate and regular rhythm.     Heart sounds: Normal heart sounds.  Pulmonary:     Effort: Pulmonary effort is normal.     Breath sounds: Normal breath sounds.  Skin:    General: Skin is warm and dry.  Neurological:     General: No focal deficit present.     Mental Status: She is alert. Mental status is at baseline.  Psychiatric:        Mood and Affect: Mood normal.        Behavior: Behavior normal.        Thought Content: Thought content normal.        Judgment: Judgment normal.        Assessment & Plan:   Problem List Items Addressed This Visit      Anxiety and depression   Lexapro  20 mg daily Symptoms are well controlled. GAD-7: 1 PHQ-9: 0 No thoughts of self harm.        Mixed hyperlipidemia   rosuvastatin  40 mg daily Tolerates well. 04/30/24: LDL 113 (goal 70) Lipid panel today.       Relevant Orders   Lipid panel   Establishing care with new doctor, encounter for - Primary   Type 2 diabetes mellitus without complication, without long-term current use of insulin  (HCC)   Taking metformin  XR 1000  mg BID, Jardiance  25 mg daily, Semaglutide  1 mg weekly Gets nausea on semaglutide , wants to stay on this dose. Has been on this dose for one year.  05/01/23: A1C: 7.1 CMP, A1C, urine microalbumin today.  Refills as needed: Jardiance  and Ozempic  today.  07/19/24: CMP: GFR: 101, K+ 4.0 Follow-up in 3 months for A1C check.       Relevant Medications   OZEMPIC , 1 MG/DOSE, 4 MG/3ML SOPN   JARDIANCE  25 MG TABS tablet   Other Relevant Orders   Comprehensive metabolic panel with GFR   Hemoglobin A1c   Microalbumin / creatinine urine ratio   Mild intermittent asthma   Has not been on medications for some time.  Reports some cough and chest tightness recently, worse with cold weather.  No wheezing. Needs albuterol  inhaler. Understands if she needs rescue inhaler often, we will start maintenance inhaler.  Continue to monitor.        Relevant Medications   albuterol  (VENTOLIN  HFA) 108 (90 Base) MCG/ACT inhaler  Agrees with plan of care discussed.  Questions answered.   Return in about 3 months (  around 12/18/2024) for DM, HLD .   Darice JONELLE Brownie, FNP

## 2024-09-19 NOTE — Assessment & Plan Note (Signed)
 rosuvastatin  40 mg daily Tolerates well. 04/30/24: LDL 113 (goal 70) Lipid panel today.

## 2024-09-19 NOTE — Assessment & Plan Note (Signed)
 Taking metformin  XR 1000  mg BID, Jardiance  25 mg daily, Semaglutide  1 mg weekly Gets nausea on semaglutide , wants to stay on this dose. Has been on this dose for one year.  05/01/23: A1C: 7.1 CMP, A1C, urine microalbumin today.  Refills as needed: Jardiance  and Ozempic  today.  07/19/24: CMP: GFR: 101, K+ 4.0 Follow-up in 3 months for A1C check.

## 2024-09-19 NOTE — Assessment & Plan Note (Signed)
 Lexapro  20 mg daily Symptoms are well controlled. GAD-7: 1 PHQ-9: 0 No thoughts of self harm.

## 2024-09-19 NOTE — Assessment & Plan Note (Signed)
 Has not been on medications for some time.  Reports some cough and chest tightness recently, worse with cold weather.  No wheezing. Needs albuterol  inhaler. Understands if she needs rescue inhaler often, we will start maintenance inhaler.  Continue to monitor.

## 2024-09-20 ENCOUNTER — Ambulatory Visit: Payer: Self-pay | Admitting: Family Medicine

## 2024-09-20 LAB — COMPREHENSIVE METABOLIC PANEL WITH GFR
ALT: 12 IU/L (ref 0–32)
AST: 12 IU/L (ref 0–40)
Albumin: 4.4 g/dL (ref 3.8–4.9)
Alkaline Phosphatase: 91 IU/L (ref 49–135)
BUN/Creatinine Ratio: 19 (ref 9–23)
BUN: 16 mg/dL (ref 6–24)
Bilirubin Total: 0.3 mg/dL (ref 0.0–1.2)
CO2: 22 mmol/L (ref 20–29)
Calcium: 10.8 mg/dL — ABNORMAL HIGH (ref 8.7–10.2)
Chloride: 102 mmol/L (ref 96–106)
Creatinine, Ser: 0.83 mg/dL (ref 0.57–1.00)
Globulin, Total: 2.3 g/dL (ref 1.5–4.5)
Glucose: 161 mg/dL — ABNORMAL HIGH (ref 70–99)
Potassium: 4.5 mmol/L (ref 3.5–5.2)
Sodium: 140 mmol/L (ref 134–144)
Total Protein: 6.7 g/dL (ref 6.0–8.5)
eGFR: 81 mL/min/1.73 (ref >59)

## 2024-09-20 LAB — LIPID PANEL
Chol/HDL Ratio: 2.2 ratio (ref 0.0–4.4)
Cholesterol, Total: 153 mg/dL (ref 100–199)
HDL: 70 mg/dL (ref >39)
LDL Chol Calc (NIH): 60 mg/dL (ref 0–99)
Triglycerides: 137 mg/dL (ref 0–149)
VLDL Cholesterol Cal: 23 mg/dL (ref 5–40)

## 2024-09-20 LAB — MICROALBUMIN / CREATININE URINE RATIO
Creatinine, Urine: 188.8 mg/dL
Microalb/Creat Ratio: 6 mg/g{creat} (ref 0–29)
Microalbumin, Urine: 11.2 ug/mL

## 2024-09-20 LAB — HEMOGLOBIN A1C
Est. average glucose Bld gHb Est-mCnc: 180 mg/dL
Hgb A1c MFr Bld: 7.9 % — ABNORMAL HIGH (ref 4.8–5.6)

## 2024-09-22 ENCOUNTER — Other Ambulatory Visit (HOSPITAL_BASED_OUTPATIENT_CLINIC_OR_DEPARTMENT_OTHER): Payer: Self-pay

## 2024-09-30 ENCOUNTER — Other Ambulatory Visit: Payer: Self-pay

## 2024-10-01 ENCOUNTER — Other Ambulatory Visit (HOSPITAL_BASED_OUTPATIENT_CLINIC_OR_DEPARTMENT_OTHER): Payer: Self-pay

## 2024-10-04 ENCOUNTER — Other Ambulatory Visit (HOSPITAL_BASED_OUTPATIENT_CLINIC_OR_DEPARTMENT_OTHER): Payer: Self-pay

## 2024-11-01 ENCOUNTER — Ambulatory Visit: Admitting: Family Medicine

## 2024-11-04 ENCOUNTER — Other Ambulatory Visit (HOSPITAL_BASED_OUTPATIENT_CLINIC_OR_DEPARTMENT_OTHER): Payer: Self-pay

## 2024-11-08 ENCOUNTER — Other Ambulatory Visit: Payer: Self-pay | Admitting: Family Medicine

## 2024-11-08 ENCOUNTER — Other Ambulatory Visit: Payer: Self-pay

## 2024-11-08 ENCOUNTER — Other Ambulatory Visit (HOSPITAL_BASED_OUTPATIENT_CLINIC_OR_DEPARTMENT_OTHER): Payer: Self-pay

## 2024-11-08 MED ORDER — ESCITALOPRAM OXALATE 20 MG PO TABS
20.0000 mg | ORAL_TABLET | Freq: Every day | ORAL | 0 refills | Status: AC
  Filled 2024-11-08: qty 90, 90d supply, fill #0

## 2024-11-08 MED ORDER — ROSUVASTATIN CALCIUM 40 MG PO TABS
40.0000 mg | ORAL_TABLET | Freq: Every day | ORAL | 0 refills | Status: AC
  Filled 2024-11-08: qty 90, 90d supply, fill #0

## 2024-11-08 MED ORDER — METFORMIN HCL ER 500 MG PO TB24
ORAL_TABLET | ORAL | 0 refills | Status: AC
  Filled 2024-11-08: qty 360, 90d supply, fill #0
